# Patient Record
Sex: Female | Born: 1995 | Hispanic: Yes | Marital: Married | Smoking: Never smoker
Health system: Southern US, Community
[De-identification: ages and names within clinical notes are randomized; demographics above are authoritative.]

## PROBLEM LIST (undated history)

## (undated) DIAGNOSIS — N9489 Other specified conditions associated with female genital organs and menstrual cycle: Secondary | ICD-10-CM

## (undated) DIAGNOSIS — F32A Depression, unspecified: Secondary | ICD-10-CM

## (undated) DIAGNOSIS — S069X9A Unspecified intracranial injury with loss of consciousness of unspecified duration, initial encounter: Secondary | ICD-10-CM

## (undated) DIAGNOSIS — F419 Anxiety disorder, unspecified: Secondary | ICD-10-CM

## (undated) HISTORY — DX: Other specified conditions associated with female genital organs and menstrual cycle: N94.89

## (undated) HISTORY — PX: TONSILLECTOMY: SUR1361

---
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
## 2015-06-08 ENCOUNTER — Other Ambulatory Visit (HOSPITAL_COMMUNITY)
Admission: RE | Admit: 2015-06-08 | Discharge: 2015-06-08 | Disposition: A | Discharge status: Home / Self Care | Payer: Managed Care, Other (non HMO) | Source: Ambulatory Visit | Attending: Family Medicine | Admitting: Family Medicine

## 2015-06-08 DIAGNOSIS — Z01419 Encounter for gynecological examination (general) (routine) without abnormal findings: Secondary | ICD-10-CM | POA: Diagnosis present

## 2017-01-05 DIAGNOSIS — N926 Irregular menstruation, unspecified: Secondary | ICD-10-CM | POA: Diagnosis not present

## 2017-06-13 DIAGNOSIS — Z0100 Encounter for examination of eyes and vision without abnormal findings: Secondary | ICD-10-CM | POA: Diagnosis not present

## 2017-06-13 DIAGNOSIS — Z23 Encounter for immunization: Secondary | ICD-10-CM | POA: Diagnosis not present

## 2017-06-13 DIAGNOSIS — Z0000 Encounter for general adult medical examination without abnormal findings: Secondary | ICD-10-CM | POA: Diagnosis not present

## 2017-07-17 HISTORY — PX: SPINAL FUSION: SHX223

## 2017-07-17 HISTORY — PX: TRACHEOSTOMY CLOSURE: SHX458

## 2017-07-17 HISTORY — PX: OTHER SURGICAL HISTORY: SHX169

## 2017-07-17 HISTORY — PX: PEG TUBE REMOVAL: SHX2187

## 2017-08-08 DIAGNOSIS — J029 Acute pharyngitis, unspecified: Secondary | ICD-10-CM | POA: Diagnosis not present

## 2017-12-15 DIAGNOSIS — S0285XA Fracture of orbit, unspecified, initial encounter for closed fracture: Secondary | ICD-10-CM

## 2017-12-15 DIAGNOSIS — S271XXA Traumatic hemothorax, initial encounter: Secondary | ICD-10-CM

## 2017-12-15 DIAGNOSIS — Z8679 Personal history of other diseases of the circulatory system: Secondary | ICD-10-CM

## 2017-12-15 DIAGNOSIS — S0230XA Fracture of orbital floor, unspecified side, initial encounter for closed fracture: Secondary | ICD-10-CM

## 2017-12-15 DIAGNOSIS — S069X9A Unspecified intracranial injury with loss of consciousness of unspecified duration, initial encounter: Secondary | ICD-10-CM

## 2017-12-15 DIAGNOSIS — I609 Nontraumatic subarachnoid hemorrhage, unspecified: Secondary | ICD-10-CM

## 2017-12-15 DIAGNOSIS — S069XAA Unspecified intracranial injury with loss of consciousness status unknown, initial encounter: Secondary | ICD-10-CM

## 2017-12-15 DIAGNOSIS — S42309A Unspecified fracture of shaft of humerus, unspecified arm, initial encounter for closed fracture: Secondary | ICD-10-CM

## 2017-12-15 DIAGNOSIS — S22089A Unspecified fracture of T11-T12 vertebra, initial encounter for closed fracture: Secondary | ICD-10-CM

## 2017-12-15 HISTORY — DX: Fracture of orbit, unspecified, initial encounter for closed fracture: S02.85XA

## 2017-12-15 HISTORY — DX: Unspecified intracranial injury with loss of consciousness status unknown, initial encounter: S06.9XAA

## 2017-12-15 HISTORY — DX: Personal history of other diseases of the circulatory system: Z86.79

## 2017-12-15 HISTORY — DX: Nontraumatic subarachnoid hemorrhage, unspecified: I60.9

## 2017-12-15 HISTORY — DX: Unspecified intracranial injury with loss of consciousness of unspecified duration, initial encounter: S06.9X9A

## 2017-12-15 HISTORY — DX: Unspecified fracture of shaft of humerus, unspecified arm, initial encounter for closed fracture: S42.309A

## 2017-12-15 HISTORY — DX: Fracture of orbital floor, unspecified side, initial encounter for closed fracture: S02.30XA

## 2017-12-15 HISTORY — DX: Unspecified fracture of t11-T12 vertebra, initial encounter for closed fracture: S22.089A

## 2017-12-15 HISTORY — DX: Traumatic hemothorax, initial encounter: S27.1XXA

## 2018-01-12 DIAGNOSIS — Y92810 Car as the place of occurrence of the external cause: Secondary | ICD-10-CM | POA: Diagnosis not present

## 2018-01-12 DIAGNOSIS — T07XXXA Unspecified multiple injuries, initial encounter: Secondary | ICD-10-CM | POA: Diagnosis not present

## 2018-01-12 DIAGNOSIS — Z87820 Personal history of traumatic brain injury: Secondary | ICD-10-CM | POA: Insufficient documentation

## 2018-01-14 HISTORY — PX: PEG TUBE PLACEMENT: SUR1034

## 2018-01-14 HISTORY — PX: TRACHEOSTOMY: SUR1362

## 2018-01-23 DIAGNOSIS — T07XXXA Unspecified multiple injuries, initial encounter: Secondary | ICD-10-CM | POA: Diagnosis not present

## 2018-01-25 DIAGNOSIS — S22089A Unspecified fracture of T11-T12 vertebra, initial encounter for closed fracture: Secondary | ICD-10-CM | POA: Diagnosis not present

## 2018-01-25 DIAGNOSIS — Z430 Encounter for attention to tracheostomy: Secondary | ICD-10-CM | POA: Diagnosis not present

## 2018-01-25 DIAGNOSIS — Z931 Gastrostomy status: Secondary | ICD-10-CM | POA: Diagnosis not present

## 2018-01-25 DIAGNOSIS — L708 Other acne: Secondary | ICD-10-CM | POA: Diagnosis not present

## 2018-01-25 DIAGNOSIS — S42332A Displaced oblique fracture of shaft of humerus, left arm, initial encounter for closed fracture: Secondary | ICD-10-CM | POA: Diagnosis not present

## 2018-01-25 DIAGNOSIS — S22088A Other fracture of T11-T12 vertebra, initial encounter for closed fracture: Secondary | ICD-10-CM | POA: Diagnosis not present

## 2018-01-25 DIAGNOSIS — J151 Pneumonia due to Pseudomonas: Secondary | ICD-10-CM | POA: Diagnosis not present

## 2018-01-25 DIAGNOSIS — S2242XA Multiple fractures of ribs, left side, initial encounter for closed fracture: Secondary | ICD-10-CM | POA: Diagnosis not present

## 2018-01-25 DIAGNOSIS — S42492A Other displaced fracture of lower end of left humerus, initial encounter for closed fracture: Secondary | ICD-10-CM | POA: Diagnosis not present

## 2018-01-25 DIAGNOSIS — R451 Restlessness and agitation: Secondary | ICD-10-CM | POA: Diagnosis not present

## 2018-01-25 DIAGNOSIS — H468 Other optic neuritis: Secondary | ICD-10-CM | POA: Diagnosis not present

## 2018-01-25 DIAGNOSIS — J9601 Acute respiratory failure with hypoxia: Secondary | ICD-10-CM | POA: Diagnosis not present

## 2018-01-25 DIAGNOSIS — Z9911 Dependence on respirator [ventilator] status: Secondary | ICD-10-CM | POA: Diagnosis not present

## 2018-01-25 DIAGNOSIS — I499 Cardiac arrhythmia, unspecified: Secondary | ICD-10-CM | POA: Diagnosis not present

## 2018-01-25 DIAGNOSIS — S0232XA Fracture of orbital floor, left side, initial encounter for closed fracture: Secondary | ICD-10-CM | POA: Diagnosis not present

## 2018-01-25 DIAGNOSIS — S04012A Injury of optic nerve, left eye, initial encounter: Secondary | ICD-10-CM | POA: Diagnosis not present

## 2018-01-25 DIAGNOSIS — R279 Unspecified lack of coordination: Secondary | ICD-10-CM | POA: Diagnosis not present

## 2018-01-25 DIAGNOSIS — S2232XA Fracture of one rib, left side, initial encounter for closed fracture: Secondary | ICD-10-CM | POA: Diagnosis not present

## 2018-01-25 DIAGNOSIS — S22081A Stable burst fracture of T11-T12 vertebra, initial encounter for closed fracture: Secondary | ICD-10-CM | POA: Diagnosis not present

## 2018-01-25 DIAGNOSIS — E878 Other disorders of electrolyte and fluid balance, not elsewhere classified: Secondary | ICD-10-CM | POA: Diagnosis not present

## 2018-01-25 DIAGNOSIS — S062X9A Diffuse traumatic brain injury with loss of consciousness of unspecified duration, initial encounter: Secondary | ICD-10-CM | POA: Diagnosis not present

## 2018-01-25 DIAGNOSIS — D62 Acute posthemorrhagic anemia: Secondary | ICD-10-CM | POA: Diagnosis not present

## 2018-01-25 DIAGNOSIS — Z743 Need for continuous supervision: Secondary | ICD-10-CM | POA: Diagnosis not present

## 2018-01-25 DIAGNOSIS — S42302A Unspecified fracture of shaft of humerus, left arm, initial encounter for closed fracture: Secondary | ICD-10-CM | POA: Diagnosis not present

## 2018-01-25 DIAGNOSIS — M7989 Other specified soft tissue disorders: Secondary | ICD-10-CM | POA: Diagnosis not present

## 2018-01-25 DIAGNOSIS — H0220C Unspecified lagophthalmos, bilateral, upper and lower eyelids: Secondary | ICD-10-CM | POA: Diagnosis not present

## 2018-01-25 DIAGNOSIS — R918 Other nonspecific abnormal finding of lung field: Secondary | ICD-10-CM | POA: Diagnosis not present

## 2018-01-25 DIAGNOSIS — Z041 Encounter for examination and observation following transport accident: Secondary | ICD-10-CM | POA: Diagnosis not present

## 2018-01-25 DIAGNOSIS — S2243XA Multiple fractures of ribs, bilateral, initial encounter for closed fracture: Secondary | ICD-10-CM | POA: Diagnosis not present

## 2018-01-25 DIAGNOSIS — I7771 Dissection of carotid artery: Secondary | ICD-10-CM | POA: Diagnosis not present

## 2018-01-25 DIAGNOSIS — R509 Fever, unspecified: Secondary | ICD-10-CM | POA: Diagnosis not present

## 2018-01-25 DIAGNOSIS — S0282XA Fracture of other specified skull and facial bones, left side, initial encounter for closed fracture: Secondary | ICD-10-CM | POA: Diagnosis not present

## 2018-01-25 DIAGNOSIS — S06369A Traumatic hemorrhage of cerebrum, unspecified, with loss of consciousness of unspecified duration, initial encounter: Secondary | ICD-10-CM | POA: Diagnosis not present

## 2018-01-25 DIAGNOSIS — S42342D Displaced spiral fracture of shaft of humerus, left arm, subsequent encounter for fracture with routine healing: Secondary | ICD-10-CM | POA: Diagnosis not present

## 2018-01-25 DIAGNOSIS — S15091A Other specified injury of right carotid artery, initial encounter: Secondary | ICD-10-CM | POA: Diagnosis not present

## 2018-01-25 DIAGNOSIS — S06819D Injury of right internal carotid artery, intracranial portion, not elsewhere classified with loss of consciousness of unspecified duration, subsequent encounter: Secondary | ICD-10-CM | POA: Diagnosis not present

## 2018-01-25 DIAGNOSIS — T1490XA Injury, unspecified, initial encounter: Secondary | ICD-10-CM | POA: Diagnosis not present

## 2018-01-25 DIAGNOSIS — Z930 Tracheostomy status: Secondary | ICD-10-CM | POA: Diagnosis not present

## 2018-01-25 DIAGNOSIS — G960 Cerebrospinal fluid leak: Secondary | ICD-10-CM | POA: Diagnosis not present

## 2018-01-25 DIAGNOSIS — R1310 Dysphagia, unspecified: Secondary | ICD-10-CM | POA: Diagnosis not present

## 2018-01-25 DIAGNOSIS — S0292XA Unspecified fracture of facial bones, initial encounter for closed fracture: Secondary | ICD-10-CM | POA: Diagnosis not present

## 2018-01-25 DIAGNOSIS — J9690 Respiratory failure, unspecified, unspecified whether with hypoxia or hypercapnia: Secondary | ICD-10-CM | POA: Diagnosis not present

## 2018-01-25 DIAGNOSIS — S069X9A Unspecified intracranial injury with loss of consciousness of unspecified duration, initial encounter: Secondary | ICD-10-CM | POA: Diagnosis not present

## 2018-01-25 DIAGNOSIS — Z9889 Other specified postprocedural states: Secondary | ICD-10-CM | POA: Diagnosis not present

## 2018-01-25 DIAGNOSIS — S0282XD Fracture of other specified skull and facial bones, left side, subsequent encounter for fracture with routine healing: Secondary | ICD-10-CM | POA: Diagnosis not present

## 2018-01-25 DIAGNOSIS — R4182 Altered mental status, unspecified: Secondary | ICD-10-CM | POA: Diagnosis not present

## 2018-01-25 DIAGNOSIS — R402434 Glasgow coma scale score 3-8, 24 hours or more after hospital admission: Secondary | ICD-10-CM | POA: Diagnosis not present

## 2018-01-25 DIAGNOSIS — S270XXA Traumatic pneumothorax, initial encounter: Secondary | ICD-10-CM | POA: Diagnosis not present

## 2018-01-26 DIAGNOSIS — S04012A Injury of optic nerve, left eye, initial encounter: Secondary | ICD-10-CM | POA: Diagnosis not present

## 2018-01-26 DIAGNOSIS — S0232XA Fracture of orbital floor, left side, initial encounter for closed fracture: Secondary | ICD-10-CM | POA: Diagnosis not present

## 2018-01-26 DIAGNOSIS — H0220C Unspecified lagophthalmos, bilateral, upper and lower eyelids: Secondary | ICD-10-CM | POA: Diagnosis not present

## 2018-01-28 DIAGNOSIS — S062X9A Diffuse traumatic brain injury with loss of consciousness of unspecified duration, initial encounter: Secondary | ICD-10-CM | POA: Diagnosis not present

## 2018-01-28 DIAGNOSIS — S22089A Unspecified fracture of T11-T12 vertebra, initial encounter for closed fracture: Secondary | ICD-10-CM | POA: Diagnosis not present

## 2018-01-28 DIAGNOSIS — S0232XA Fracture of orbital floor, left side, initial encounter for closed fracture: Secondary | ICD-10-CM | POA: Diagnosis not present

## 2018-01-28 DIAGNOSIS — R402434 Glasgow coma scale score 3-8, 24 hours or more after hospital admission: Secondary | ICD-10-CM | POA: Diagnosis not present

## 2018-01-29 DIAGNOSIS — S22089A Unspecified fracture of T11-T12 vertebra, initial encounter for closed fracture: Secondary | ICD-10-CM | POA: Diagnosis not present

## 2018-01-29 DIAGNOSIS — R509 Fever, unspecified: Secondary | ICD-10-CM | POA: Diagnosis not present

## 2018-01-29 DIAGNOSIS — I499 Cardiac arrhythmia, unspecified: Secondary | ICD-10-CM | POA: Diagnosis not present

## 2018-01-29 DIAGNOSIS — S0232XA Fracture of orbital floor, left side, initial encounter for closed fracture: Secondary | ICD-10-CM | POA: Diagnosis not present

## 2018-01-30 DIAGNOSIS — S069X9A Unspecified intracranial injury with loss of consciousness of unspecified duration, initial encounter: Secondary | ICD-10-CM | POA: Diagnosis not present

## 2018-01-30 DIAGNOSIS — S0232XA Fracture of orbital floor, left side, initial encounter for closed fracture: Secondary | ICD-10-CM | POA: Diagnosis not present

## 2018-01-30 DIAGNOSIS — E878 Other disorders of electrolyte and fluid balance, not elsewhere classified: Secondary | ICD-10-CM | POA: Diagnosis not present

## 2018-01-30 DIAGNOSIS — R451 Restlessness and agitation: Secondary | ICD-10-CM | POA: Diagnosis not present

## 2018-01-31 DIAGNOSIS — J9601 Acute respiratory failure with hypoxia: Secondary | ICD-10-CM | POA: Diagnosis not present

## 2018-01-31 DIAGNOSIS — S069X9A Unspecified intracranial injury with loss of consciousness of unspecified duration, initial encounter: Secondary | ICD-10-CM | POA: Diagnosis not present

## 2018-01-31 DIAGNOSIS — L708 Other acne: Secondary | ICD-10-CM | POA: Diagnosis not present

## 2018-01-31 DIAGNOSIS — J151 Pneumonia due to Pseudomonas: Secondary | ICD-10-CM | POA: Diagnosis not present

## 2018-01-31 DIAGNOSIS — S0232XA Fracture of orbital floor, left side, initial encounter for closed fracture: Secondary | ICD-10-CM | POA: Diagnosis not present

## 2018-02-01 DIAGNOSIS — S0232XA Fracture of orbital floor, left side, initial encounter for closed fracture: Secondary | ICD-10-CM | POA: Diagnosis not present

## 2018-02-01 DIAGNOSIS — S069X9A Unspecified intracranial injury with loss of consciousness of unspecified duration, initial encounter: Secondary | ICD-10-CM | POA: Diagnosis not present

## 2018-02-01 DIAGNOSIS — J9601 Acute respiratory failure with hypoxia: Secondary | ICD-10-CM | POA: Diagnosis not present

## 2018-02-01 DIAGNOSIS — L708 Other acne: Secondary | ICD-10-CM | POA: Diagnosis not present

## 2018-02-01 DIAGNOSIS — J151 Pneumonia due to Pseudomonas: Secondary | ICD-10-CM | POA: Diagnosis not present

## 2018-02-06 DIAGNOSIS — S066X6A Traumatic subarachnoid hemorrhage with loss of consciousness greater than 24 hours without return to pre-existing conscious level with patient surviving, initial encounter: Secondary | ICD-10-CM | POA: Diagnosis not present

## 2018-02-06 DIAGNOSIS — S066X9A Traumatic subarachnoid hemorrhage with loss of consciousness of unspecified duration, initial encounter: Secondary | ICD-10-CM | POA: Diagnosis not present

## 2018-02-06 DIAGNOSIS — I10 Essential (primary) hypertension: Secondary | ICD-10-CM | POA: Diagnosis not present

## 2018-02-06 DIAGNOSIS — Z931 Gastrostomy status: Secondary | ICD-10-CM | POA: Diagnosis not present

## 2018-02-06 DIAGNOSIS — X58XXXA Exposure to other specified factors, initial encounter: Secondary | ICD-10-CM | POA: Diagnosis not present

## 2018-02-06 DIAGNOSIS — S42492A Other displaced fracture of lower end of left humerus, initial encounter for closed fracture: Secondary | ICD-10-CM | POA: Diagnosis not present

## 2018-02-06 DIAGNOSIS — T07XXXA Unspecified multiple injuries, initial encounter: Secondary | ICD-10-CM | POA: Diagnosis not present

## 2018-02-06 DIAGNOSIS — Z743 Need for continuous supervision: Secondary | ICD-10-CM | POA: Diagnosis not present

## 2018-02-06 DIAGNOSIS — S42309A Unspecified fracture of shaft of humerus, unspecified arm, initial encounter for closed fracture: Secondary | ICD-10-CM | POA: Insufficient documentation

## 2018-02-06 DIAGNOSIS — S42492D Other displaced fracture of lower end of left humerus, subsequent encounter for fracture with routine healing: Secondary | ICD-10-CM | POA: Diagnosis not present

## 2018-02-06 DIAGNOSIS — Z8781 Personal history of (healed) traumatic fracture: Secondary | ICD-10-CM | POA: Insufficient documentation

## 2018-02-06 DIAGNOSIS — R7881 Bacteremia: Secondary | ICD-10-CM | POA: Diagnosis not present

## 2018-02-06 DIAGNOSIS — S22089A Unspecified fracture of T11-T12 vertebra, initial encounter for closed fracture: Secondary | ICD-10-CM | POA: Diagnosis not present

## 2018-02-06 DIAGNOSIS — Z0389 Encounter for observation for other suspected diseases and conditions ruled out: Secondary | ICD-10-CM | POA: Diagnosis not present

## 2018-02-06 DIAGNOSIS — R279 Unspecified lack of coordination: Secondary | ICD-10-CM | POA: Diagnosis not present

## 2018-02-06 DIAGNOSIS — S42402D Unspecified fracture of lower end of left humerus, subsequent encounter for fracture with routine healing: Secondary | ICD-10-CM | POA: Diagnosis not present

## 2018-02-06 DIAGNOSIS — G908 Other disorders of autonomic nervous system: Secondary | ICD-10-CM | POA: Diagnosis not present

## 2018-02-06 DIAGNOSIS — J9690 Respiratory failure, unspecified, unspecified whether with hypoxia or hypercapnia: Secondary | ICD-10-CM | POA: Diagnosis not present

## 2018-02-06 DIAGNOSIS — I499 Cardiac arrhythmia, unspecified: Secondary | ICD-10-CM | POA: Diagnosis not present

## 2018-02-06 DIAGNOSIS — X58XXXD Exposure to other specified factors, subsequent encounter: Secondary | ICD-10-CM | POA: Diagnosis not present

## 2018-02-06 DIAGNOSIS — M25551 Pain in right hip: Secondary | ICD-10-CM | POA: Diagnosis not present

## 2018-02-06 DIAGNOSIS — Z930 Tracheostomy status: Secondary | ICD-10-CM | POA: Diagnosis not present

## 2018-02-06 DIAGNOSIS — J9610 Chronic respiratory failure, unspecified whether with hypoxia or hypercapnia: Secondary | ICD-10-CM | POA: Diagnosis not present

## 2018-02-06 DIAGNOSIS — S066X6D Traumatic subarachnoid hemorrhage with loss of consciousness greater than 24 hours without return to pre-existing conscious level with patient surviving, subsequent encounter: Secondary | ICD-10-CM | POA: Diagnosis not present

## 2018-02-06 DIAGNOSIS — I609 Nontraumatic subarachnoid hemorrhage, unspecified: Secondary | ICD-10-CM | POA: Diagnosis not present

## 2018-02-06 DIAGNOSIS — R443 Hallucinations, unspecified: Secondary | ICD-10-CM | POA: Diagnosis not present

## 2018-02-06 DIAGNOSIS — I7771 Dissection of carotid artery: Secondary | ICD-10-CM | POA: Diagnosis not present

## 2018-02-06 DIAGNOSIS — E870 Hyperosmolality and hypernatremia: Secondary | ICD-10-CM | POA: Diagnosis not present

## 2018-02-06 DIAGNOSIS — K5900 Constipation, unspecified: Secondary | ICD-10-CM | POA: Diagnosis not present

## 2018-02-06 DIAGNOSIS — S0990XD Unspecified injury of head, subsequent encounter: Secondary | ICD-10-CM | POA: Diagnosis not present

## 2018-02-06 DIAGNOSIS — S04012A Injury of optic nerve, left eye, initial encounter: Secondary | ICD-10-CM | POA: Diagnosis not present

## 2018-02-06 DIAGNOSIS — Z781 Physical restraint status: Secondary | ICD-10-CM | POA: Diagnosis not present

## 2018-02-06 DIAGNOSIS — S062X9A Diffuse traumatic brain injury with loss of consciousness of unspecified duration, initial encounter: Secondary | ICD-10-CM | POA: Diagnosis not present

## 2018-02-06 DIAGNOSIS — N319 Neuromuscular dysfunction of bladder, unspecified: Secondary | ICD-10-CM | POA: Diagnosis not present

## 2018-02-06 DIAGNOSIS — Z981 Arthrodesis status: Secondary | ICD-10-CM | POA: Diagnosis not present

## 2018-02-06 DIAGNOSIS — S062X9D Diffuse traumatic brain injury with loss of consciousness of unspecified duration, subsequent encounter: Secondary | ICD-10-CM | POA: Diagnosis not present

## 2018-02-06 DIAGNOSIS — M25552 Pain in left hip: Secondary | ICD-10-CM | POA: Diagnosis not present

## 2018-02-06 DIAGNOSIS — R1310 Dysphagia, unspecified: Secondary | ICD-10-CM | POA: Diagnosis not present

## 2018-02-11 DIAGNOSIS — S066X6D Traumatic subarachnoid hemorrhage with loss of consciousness greater than 24 hours without return to pre-existing conscious level with patient surviving, subsequent encounter: Secondary | ICD-10-CM | POA: Diagnosis not present

## 2018-02-11 DIAGNOSIS — T07XXXA Unspecified multiple injuries, initial encounter: Secondary | ICD-10-CM | POA: Diagnosis not present

## 2018-02-11 DIAGNOSIS — S42492A Other displaced fracture of lower end of left humerus, initial encounter for closed fracture: Secondary | ICD-10-CM | POA: Diagnosis not present

## 2018-02-12 DIAGNOSIS — S066X6D Traumatic subarachnoid hemorrhage with loss of consciousness greater than 24 hours without return to pre-existing conscious level with patient surviving, subsequent encounter: Secondary | ICD-10-CM | POA: Diagnosis not present

## 2018-02-13 DIAGNOSIS — S066X6D Traumatic subarachnoid hemorrhage with loss of consciousness greater than 24 hours without return to pre-existing conscious level with patient surviving, subsequent encounter: Secondary | ICD-10-CM | POA: Diagnosis not present

## 2018-02-14 DIAGNOSIS — S066X6D Traumatic subarachnoid hemorrhage with loss of consciousness greater than 24 hours without return to pre-existing conscious level with patient surviving, subsequent encounter: Secondary | ICD-10-CM | POA: Diagnosis not present

## 2018-02-15 DIAGNOSIS — S066X6D Traumatic subarachnoid hemorrhage with loss of consciousness greater than 24 hours without return to pre-existing conscious level with patient surviving, subsequent encounter: Secondary | ICD-10-CM | POA: Diagnosis not present

## 2018-02-16 DIAGNOSIS — S066X6D Traumatic subarachnoid hemorrhage with loss of consciousness greater than 24 hours without return to pre-existing conscious level with patient surviving, subsequent encounter: Secondary | ICD-10-CM | POA: Diagnosis not present

## 2018-02-17 DIAGNOSIS — S066X6D Traumatic subarachnoid hemorrhage with loss of consciousness greater than 24 hours without return to pre-existing conscious level with patient surviving, subsequent encounter: Secondary | ICD-10-CM | POA: Diagnosis not present

## 2018-02-18 DIAGNOSIS — Z981 Arthrodesis status: Secondary | ICD-10-CM | POA: Diagnosis not present

## 2018-02-18 DIAGNOSIS — X58XXXA Exposure to other specified factors, initial encounter: Secondary | ICD-10-CM | POA: Diagnosis not present

## 2018-02-18 DIAGNOSIS — S22089A Unspecified fracture of T11-T12 vertebra, initial encounter for closed fracture: Secondary | ICD-10-CM | POA: Diagnosis not present

## 2018-02-19 DIAGNOSIS — S066X6D Traumatic subarachnoid hemorrhage with loss of consciousness greater than 24 hours without return to pre-existing conscious level with patient surviving, subsequent encounter: Secondary | ICD-10-CM | POA: Diagnosis not present

## 2018-02-20 DIAGNOSIS — S066X6D Traumatic subarachnoid hemorrhage with loss of consciousness greater than 24 hours without return to pre-existing conscious level with patient surviving, subsequent encounter: Secondary | ICD-10-CM | POA: Diagnosis not present

## 2018-03-03 DIAGNOSIS — K5900 Constipation, unspecified: Secondary | ICD-10-CM | POA: Diagnosis not present

## 2018-03-06 DIAGNOSIS — J189 Pneumonia, unspecified organism: Secondary | ICD-10-CM | POA: Diagnosis not present

## 2018-03-06 DIAGNOSIS — N319 Neuromuscular dysfunction of bladder, unspecified: Secondary | ICD-10-CM | POA: Diagnosis not present

## 2018-03-06 DIAGNOSIS — H0220C Unspecified lagophthalmos, bilateral, upper and lower eyelids: Secondary | ICD-10-CM | POA: Diagnosis not present

## 2018-03-06 DIAGNOSIS — S42402D Unspecified fracture of lower end of left humerus, subsequent encounter for fracture with routine healing: Secondary | ICD-10-CM | POA: Diagnosis not present

## 2018-03-06 DIAGNOSIS — S06309D Unspecified focal traumatic brain injury with loss of consciousness of unspecified duration, subsequent encounter: Secondary | ICD-10-CM | POA: Diagnosis not present

## 2018-03-06 DIAGNOSIS — I609 Nontraumatic subarachnoid hemorrhage, unspecified: Secondary | ICD-10-CM | POA: Diagnosis not present

## 2018-03-06 DIAGNOSIS — S42302A Unspecified fracture of shaft of humerus, left arm, initial encounter for closed fracture: Secondary | ICD-10-CM | POA: Diagnosis not present

## 2018-03-06 DIAGNOSIS — X58XXXD Exposure to other specified factors, subsequent encounter: Secondary | ICD-10-CM | POA: Diagnosis not present

## 2018-03-06 DIAGNOSIS — S22089D Unspecified fracture of T11-T12 vertebra, subsequent encounter for fracture with routine healing: Secondary | ICD-10-CM | POA: Diagnosis not present

## 2018-03-06 DIAGNOSIS — I7771 Dissection of carotid artery: Secondary | ICD-10-CM | POA: Diagnosis not present

## 2018-03-06 DIAGNOSIS — R252 Cramp and spasm: Secondary | ICD-10-CM | POA: Diagnosis not present

## 2018-03-06 DIAGNOSIS — S15091D Other specified injury of right carotid artery, subsequent encounter: Secondary | ICD-10-CM | POA: Diagnosis not present

## 2018-03-06 DIAGNOSIS — S0282XD Fracture of other specified skull and facial bones, left side, subsequent encounter for fracture with routine healing: Secondary | ICD-10-CM | POA: Diagnosis not present

## 2018-03-06 DIAGNOSIS — R21 Rash and other nonspecific skin eruption: Secondary | ICD-10-CM | POA: Diagnosis not present

## 2018-03-06 DIAGNOSIS — R262 Difficulty in walking, not elsewhere classified: Secondary | ICD-10-CM | POA: Diagnosis not present

## 2018-03-06 DIAGNOSIS — S42351D Displaced comminuted fracture of shaft of humerus, right arm, subsequent encounter for fracture with routine healing: Secondary | ICD-10-CM | POA: Diagnosis not present

## 2018-03-06 DIAGNOSIS — S271XXD Traumatic hemothorax, subsequent encounter: Secondary | ICD-10-CM | POA: Diagnosis not present

## 2018-03-06 DIAGNOSIS — S42302D Unspecified fracture of shaft of humerus, left arm, subsequent encounter for fracture with routine healing: Secondary | ICD-10-CM | POA: Diagnosis not present

## 2018-03-06 DIAGNOSIS — H468 Other optic neuritis: Secondary | ICD-10-CM | POA: Diagnosis not present

## 2018-03-06 DIAGNOSIS — S066X6D Traumatic subarachnoid hemorrhage with loss of consciousness greater than 24 hours without return to pre-existing conscious level with patient surviving, subsequent encounter: Secondary | ICD-10-CM | POA: Diagnosis not present

## 2018-03-06 DIAGNOSIS — Z431 Encounter for attention to gastrostomy: Secondary | ICD-10-CM | POA: Diagnosis not present

## 2018-03-06 DIAGNOSIS — N912 Amenorrhea, unspecified: Secondary | ICD-10-CM | POA: Diagnosis not present

## 2018-03-06 DIAGNOSIS — G4700 Insomnia, unspecified: Secondary | ICD-10-CM | POA: Diagnosis not present

## 2018-03-06 DIAGNOSIS — S42492D Other displaced fracture of lower end of left humerus, subsequent encounter for fracture with routine healing: Secondary | ICD-10-CM | POA: Diagnosis not present

## 2018-03-06 DIAGNOSIS — R1310 Dysphagia, unspecified: Secondary | ICD-10-CM | POA: Diagnosis not present

## 2018-03-09 DIAGNOSIS — S066X6D Traumatic subarachnoid hemorrhage with loss of consciousness greater than 24 hours without return to pre-existing conscious level with patient surviving, subsequent encounter: Secondary | ICD-10-CM | POA: Diagnosis not present

## 2018-03-19 DIAGNOSIS — S066X6D Traumatic subarachnoid hemorrhage with loss of consciousness greater than 24 hours without return to pre-existing conscious level with patient surviving, subsequent encounter: Secondary | ICD-10-CM | POA: Diagnosis not present

## 2018-03-20 DIAGNOSIS — S066X6D Traumatic subarachnoid hemorrhage with loss of consciousness greater than 24 hours without return to pre-existing conscious level with patient surviving, subsequent encounter: Secondary | ICD-10-CM | POA: Diagnosis not present

## 2018-03-21 DIAGNOSIS — S066X6D Traumatic subarachnoid hemorrhage with loss of consciousness greater than 24 hours without return to pre-existing conscious level with patient surviving, subsequent encounter: Secondary | ICD-10-CM | POA: Diagnosis not present

## 2018-03-22 DIAGNOSIS — S066X6D Traumatic subarachnoid hemorrhage with loss of consciousness greater than 24 hours without return to pre-existing conscious level with patient surviving, subsequent encounter: Secondary | ICD-10-CM | POA: Diagnosis not present

## 2018-04-02 DIAGNOSIS — S066X6D Traumatic subarachnoid hemorrhage with loss of consciousness greater than 24 hours without return to pre-existing conscious level with patient surviving, subsequent encounter: Secondary | ICD-10-CM | POA: Diagnosis not present

## 2018-04-03 DIAGNOSIS — S066X6D Traumatic subarachnoid hemorrhage with loss of consciousness greater than 24 hours without return to pre-existing conscious level with patient surviving, subsequent encounter: Secondary | ICD-10-CM | POA: Diagnosis not present

## 2018-04-04 DIAGNOSIS — S066X6D Traumatic subarachnoid hemorrhage with loss of consciousness greater than 24 hours without return to pre-existing conscious level with patient surviving, subsequent encounter: Secondary | ICD-10-CM | POA: Diagnosis not present

## 2018-04-04 MED ORDER — MECLIZINE HCL 25 MG PO TABS
12.50 | ORAL_TABLET | ORAL | Status: DC
Start: ? — End: 2018-04-04

## 2018-04-04 MED ORDER — GENERIC EXTERNAL MEDICATION
10.00 | Status: DC
Start: ? — End: 2018-04-04

## 2018-04-04 MED ORDER — FLUOXETINE HCL 20 MG PO CAPS
20.00 | ORAL_CAPSULE | ORAL | Status: DC
Start: 2018-04-05 — End: 2018-04-04

## 2018-04-04 MED ORDER — GENERIC EXTERNAL MEDICATION
4.00 | Status: DC
Start: ? — End: 2018-04-04

## 2018-04-04 MED ORDER — CALCIUM CITRATE-VITAMIN D 315-250 MG-UNIT PO TABS
2.00 | ORAL_TABLET | ORAL | Status: DC
Start: 2018-04-04 — End: 2018-04-04

## 2018-04-04 MED ORDER — OXYCODONE HCL 5 MG PO TABS
2.50 | ORAL_TABLET | ORAL | Status: DC
Start: ? — End: 2018-04-04

## 2018-04-04 MED ORDER — GENERIC EXTERNAL MEDICATION
25.00 | Status: DC
Start: ? — End: 2018-04-04

## 2018-04-04 MED ORDER — MELATONIN 3 MG PO TABS
6.00 | ORAL_TABLET | ORAL | Status: DC
Start: 2018-04-04 — End: 2018-04-04

## 2018-04-04 MED ORDER — GENERIC EXTERNAL MEDICATION
650.00 | Status: DC
Start: ? — End: 2018-04-04

## 2018-04-04 MED ORDER — LACTULOSE 10 GM/15ML PO SOLN
20.00 | ORAL | Status: DC
Start: ? — End: 2018-04-04

## 2018-04-05 DIAGNOSIS — S42309A Unspecified fracture of shaft of humerus, unspecified arm, initial encounter for closed fracture: Secondary | ICD-10-CM | POA: Diagnosis not present

## 2018-04-05 DIAGNOSIS — S0291XA Unspecified fracture of skull, initial encounter for closed fracture: Secondary | ICD-10-CM | POA: Diagnosis not present

## 2018-04-05 DIAGNOSIS — S22089A Unspecified fracture of T11-T12 vertebra, initial encounter for closed fracture: Secondary | ICD-10-CM | POA: Diagnosis not present

## 2018-04-08 ENCOUNTER — Ambulatory Visit: Payer: BLUE CROSS/BLUE SHIELD | Attending: Physical Medicine and Rehabilitation | Admitting: Speech Pathology

## 2018-04-08 ENCOUNTER — Encounter: Payer: Self-pay | Admitting: Occupational Therapy

## 2018-04-08 ENCOUNTER — Other Ambulatory Visit: Payer: Self-pay

## 2018-04-08 ENCOUNTER — Ambulatory Visit: Payer: BLUE CROSS/BLUE SHIELD | Admitting: Occupational Therapy

## 2018-04-08 ENCOUNTER — Ambulatory Visit: Payer: BLUE CROSS/BLUE SHIELD

## 2018-04-08 DIAGNOSIS — M6281 Muscle weakness (generalized): Secondary | ICD-10-CM

## 2018-04-08 DIAGNOSIS — R2689 Other abnormalities of gait and mobility: Secondary | ICD-10-CM

## 2018-04-08 DIAGNOSIS — R278 Other lack of coordination: Secondary | ICD-10-CM | POA: Diagnosis not present

## 2018-04-08 DIAGNOSIS — R41841 Cognitive communication deficit: Secondary | ICD-10-CM | POA: Insufficient documentation

## 2018-04-08 DIAGNOSIS — R490 Dysphonia: Secondary | ICD-10-CM

## 2018-04-08 DIAGNOSIS — R2681 Unsteadiness on feet: Secondary | ICD-10-CM | POA: Insufficient documentation
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
## 2018-04-08 NOTE — Patient Instructions (Signed)
Access Code: [email or web address]  URL: [email or web address]  Date: 04/08/2018  Prepared by: Roxana Hires   Exercises  Backward Walking with Counter Support - 10 reps - 2 sets - 2x daily - 7x weekly  Tandem Stance with Head Rotation - 3 reps - 30 seconds hold - 2x daily - 7x weekly

## 2018-04-09 ENCOUNTER — Encounter: Payer: Self-pay | Admitting: Speech Pathology
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
## 2018-04-10 DIAGNOSIS — F411 Generalized anxiety disorder: Secondary | ICD-10-CM | POA: Diagnosis not present

## 2018-04-10 DIAGNOSIS — R490 Dysphonia: Secondary | ICD-10-CM | POA: Diagnosis not present

## 2018-04-10 DIAGNOSIS — N949 Unspecified condition associated with female genital organs and menstrual cycle: Secondary | ICD-10-CM | POA: Diagnosis not present

## 2018-04-10 DIAGNOSIS — M79603 Pain in arm, unspecified: Secondary | ICD-10-CM | POA: Diagnosis not present

## 2018-04-11 ENCOUNTER — Encounter: Payer: Self-pay | Admitting: Speech Pathology

## 2018-04-11 ENCOUNTER — Encounter: Payer: Self-pay | Admitting: Physical Therapy

## 2018-04-11 ENCOUNTER — Ambulatory Visit: Payer: BLUE CROSS/BLUE SHIELD | Admitting: Speech Pathology

## 2018-04-11 ENCOUNTER — Ambulatory Visit: Payer: BLUE CROSS/BLUE SHIELD | Admitting: Physical Therapy

## 2018-04-11 VITALS — BP 127/67 | HR 76

## 2018-04-11 DIAGNOSIS — R490 Dysphonia: Secondary | ICD-10-CM | POA: Diagnosis not present

## 2018-04-11 DIAGNOSIS — R41841 Cognitive communication deficit: Secondary | ICD-10-CM

## 2018-04-11 DIAGNOSIS — R2689 Other abnormalities of gait and mobility: Secondary | ICD-10-CM

## 2018-04-11 DIAGNOSIS — R2681 Unsteadiness on feet: Secondary | ICD-10-CM

## 2018-04-11 DIAGNOSIS — R278 Other lack of coordination: Secondary | ICD-10-CM | POA: Diagnosis not present

## 2018-04-11 DIAGNOSIS — M6281 Muscle weakness (generalized): Secondary | ICD-10-CM | POA: Diagnosis not present

## 2018-04-11 NOTE — Therapy (Signed)
[place] MAIN [facility] SERVICES [address] [place], [place], [address] Phone: [phone]   Fax:  [phone]  Speech Language Pathology Treatment  Patient Details  Name: Monica Forbes MRN: [id number] Date of Birth: [birth year] Referring Provider (SLP): Doran Clay    Encounter Date: 04/11/2018  End of Session - 04/11/18 1403    Visit Number  2    Number of Visits  9    Date for SLP Re-Evaluation  05/08/18    SLP Start Time  1324    SLP Stop Time   4010    SLP Time Calculation (min)  50 min    Activity Tolerance  Patient tolerated treatment well       History reviewed. No pertinent past medical history.  History reviewed. No pertinent surgical history.  There were no vitals filed for this visit.  Subjective Assessment - 04/11/18 1358    Subjective  Pt seen with fiance in attendance    Patient is accompained by:  Family member   fiance   Currently in Pain?  No/denies            ADULT SLP TREATMENT - 04/11/18 0001      General Information   Behavior/Cognition  Alert;Cooperative;Pleasant mood      Treatment Provided   Treatment provided  Cognitive-Linquistic      Pain Assessment   Pain Assessment  No/denies pain      Cognitive-Linquistic Treatment   Treatment focused on  Cognition;Patient/family/caregiver education    Skilled Treatment  SLP reviewed levels of attention with pt/fiance, and discussed goals established at evaluation. Pt recalled 10 words with 40% accuracy, increasing to 100% with repetition of list or cues. She recalled 90% after 10 minute delay. Visual scanning task completed accurately with pt using finger as guide. 1 error when using visual scanning only. Pt completed deductive reasoning puzzle #1 in 6 minutes with 100% accuracy, no assistance given. She completed puzzle #2 with music playing as a distraction, in 5 minutes, 100% accuracy.       Assessment / Recommendations / Plan   Plan  Continue with  current plan of care      Progression Toward Goals   Progression toward goals  Progressing toward goals       SLP Education - 04/11/18 1403    Education Details  goals, levels of attention, encouraged pt to complete deductive reasoning puzzles with increased attention/distractions at home.    Person(s) Educated  Patient   fiance   Methods  Explanation;Demonstration;Handout    Comprehension  Verbalized understanding         SLP Long Term Goals - 04/11/18 1406      SLP LONG TERM GOAL #1   Title  Patient will demonstrate functional cognitive-communication skills for successful return to work.    Time  4    Period  Weeks    Status  On-going    Target Date  05/08/18      SLP LONG TERM GOAL #2   Title  Patient will complete complex attention/vigilance/memory tasks with 80% accuracy.    Time  4    Period  Weeks    Status  On-going    Target Date  05/08/18      SLP LONG TERM GOAL #3   Title  Patient will complete complex executive function activities with 80% accuracy.    Time  4    Period  Weeks    Status  On-going  Target Date  05/08/18       Plan - 04/11/18 1404    Clinical Impression Statement  Pt reports being organized prior to injury, and verbalizes the need to develop systems to stay on track, as she is more distractible now. Pt is motivated to improve, and reports "it will be fine as long as I check my work". Continued skilled ST intervention is recommended to maximize executive functions to allow pt to return to work and full independence.    Speech Therapy Frequency  1x /week    Duration  4 weeks    Treatment/Interventions  Cognitive reorganization;Patient/family education;SLP instruction and feedback    Potential to Achieve Goals  Good    Potential Considerations  Ability to learn/carryover information;Pain level;Co-morbidities;Previous level of function;Cooperation/participation level;Severity of impairments;Medical prognosis    SLP Home Exercise Plan  TBD-  has respiratory muscle training equipment, deductive reasoning puzzles with distractions, alternating/divided attention challenges    Consulted and Agree with Plan of Care  Patient;Other (Comment)   fiance      Patient will benefit from skilled therapeutic intervention in order to improve the following deficits and impairments:   Cognitive communication deficit    Problem List There are no active problems to display for this patient.  Cristoval Teall B. Quentin Ore, [facility], CCC-SLP Speech Language Pathologist  Shonna Chock 04/11/2018, 2:08 PM  [place] MAIN [facility] SERVICES [address] [place], [place], [address] Phone: [phone]   Fax:  [phone]   Name: NNEOMA HARRAL MRN: [id number] Date of Birth: 18-Sep-1995
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
## 2018-04-13 NOTE — Therapy (Signed)
[place] MAIN [facility] SERVICES [address] [place], [place], [address] Phone: [phone]   Fax:  [phone]  Occupational Therapy Evaluation  Patient Details  Name: Monica Forbes MRN: [id number] Date of Birth: 1996/01/17 No data recorded  Encounter Date: 04/08/2018    History reviewed. No pertinent past medical history.  History reviewed. No pertinent surgical history.  There were no vitals filed for this visit.                                    Patient will benefit from skilled therapeutic intervention in order to improve the following deficits and impairments:     Visit Diagnosis: Muscle weakness (generalized)  Other lack of coordination  Cognitive communication deficit    Problem List There are no active problems to display for this patient.  Achilles Dunk, OTR/L, CLT  Ygnacio Fecteau 04/13/2018, 7:19 PM  [place] MAIN [facility] SERVICES [address] [place], [place], [address] Phone: [phone]   Fax:  [phone]  Name: Monica Forbes MRN: [id number] Date of Birth: Apr 11, 1996

## 2018-04-15 ENCOUNTER — Encounter: Payer: Managed Care, Other (non HMO) | Admitting: Occupational Therapy

## 2018-04-15 ENCOUNTER — Encounter: Payer: Self-pay | Admitting: Physical Therapy

## 2018-04-15 ENCOUNTER — Ambulatory Visit: Payer: BLUE CROSS/BLUE SHIELD | Admitting: Physical Therapy

## 2018-04-15 DIAGNOSIS — R2689 Other abnormalities of gait and mobility: Secondary | ICD-10-CM | POA: Diagnosis not present

## 2018-04-15 DIAGNOSIS — R2681 Unsteadiness on feet: Secondary | ICD-10-CM

## 2018-04-15 DIAGNOSIS — R490 Dysphonia: Secondary | ICD-10-CM | POA: Diagnosis not present

## 2018-04-15 DIAGNOSIS — M6281 Muscle weakness (generalized): Secondary | ICD-10-CM

## 2018-04-15 DIAGNOSIS — R278 Other lack of coordination: Secondary | ICD-10-CM

## 2018-04-15 DIAGNOSIS — R41841 Cognitive communication deficit: Secondary | ICD-10-CM | POA: Diagnosis not present
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
## 2018-04-16 ENCOUNTER — Other Ambulatory Visit: Payer: Self-pay | Admitting: Family Medicine

## 2018-04-16 DIAGNOSIS — N949 Unspecified condition associated with female genital organs and menstrual cycle: Secondary | ICD-10-CM

## 2018-04-17 ENCOUNTER — Ambulatory Visit: Payer: BLUE CROSS/BLUE SHIELD | Attending: Physical Medicine and Rehabilitation | Admitting: Occupational Therapy

## 2018-04-17 DIAGNOSIS — R41841 Cognitive communication deficit: Secondary | ICD-10-CM | POA: Insufficient documentation

## 2018-04-17 DIAGNOSIS — R490 Dysphonia: Secondary | ICD-10-CM | POA: Diagnosis not present

## 2018-04-17 DIAGNOSIS — M6281 Muscle weakness (generalized): Secondary | ICD-10-CM | POA: Diagnosis not present

## 2018-04-17 DIAGNOSIS — R2689 Other abnormalities of gait and mobility: Secondary | ICD-10-CM | POA: Insufficient documentation

## 2018-04-17 DIAGNOSIS — R2681 Unsteadiness on feet: Secondary | ICD-10-CM | POA: Diagnosis not present

## 2018-04-17 DIAGNOSIS — R278 Other lack of coordination: Secondary | ICD-10-CM | POA: Diagnosis not present

## 2018-04-18 ENCOUNTER — Ambulatory Visit: Payer: BLUE CROSS/BLUE SHIELD | Admitting: Speech Pathology

## 2018-04-18 DIAGNOSIS — R490 Dysphonia: Secondary | ICD-10-CM

## 2018-04-18 DIAGNOSIS — R2681 Unsteadiness on feet: Secondary | ICD-10-CM | POA: Diagnosis not present

## 2018-04-18 DIAGNOSIS — R2689 Other abnormalities of gait and mobility: Secondary | ICD-10-CM | POA: Diagnosis not present

## 2018-04-18 DIAGNOSIS — R41841 Cognitive communication deficit: Secondary | ICD-10-CM | POA: Diagnosis not present

## 2018-04-18 DIAGNOSIS — M6281 Muscle weakness (generalized): Secondary | ICD-10-CM | POA: Diagnosis not present

## 2018-04-18 DIAGNOSIS — R278 Other lack of coordination: Secondary | ICD-10-CM | POA: Diagnosis not present

## 2018-04-19 ENCOUNTER — Encounter: Payer: Self-pay | Admitting: Speech Pathology

## 2018-04-19 DIAGNOSIS — H527 Unspecified disorder of refraction: Secondary | ICD-10-CM | POA: Diagnosis not present

## 2018-04-19 DIAGNOSIS — S0285XD Fracture of orbit, unspecified, subsequent encounter for fracture with routine healing: Secondary | ICD-10-CM | POA: Diagnosis not present

## 2018-04-19 DIAGNOSIS — H468 Other optic neuritis: Secondary | ICD-10-CM | POA: Diagnosis not present
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
## 2018-04-21 NOTE — Addendum Note (Signed)
Addended by: Garlon Hatchet T on: 04/21/2018 01:07 PM   Modules accepted: Orders

## 2018-04-22 ENCOUNTER — Ambulatory Visit: Payer: BLUE CROSS/BLUE SHIELD | Admitting: Speech Pathology

## 2018-04-22 DIAGNOSIS — R2681 Unsteadiness on feet: Secondary | ICD-10-CM | POA: Diagnosis not present

## 2018-04-22 DIAGNOSIS — R490 Dysphonia: Secondary | ICD-10-CM

## 2018-04-22 DIAGNOSIS — R278 Other lack of coordination: Secondary | ICD-10-CM | POA: Diagnosis not present

## 2018-04-22 DIAGNOSIS — M6281 Muscle weakness (generalized): Secondary | ICD-10-CM | POA: Diagnosis not present

## 2018-04-22 DIAGNOSIS — R41841 Cognitive communication deficit: Secondary | ICD-10-CM

## 2018-04-22 DIAGNOSIS — R2689 Other abnormalities of gait and mobility: Secondary | ICD-10-CM | POA: Diagnosis not present

## 2018-04-23 ENCOUNTER — Encounter: Payer: Self-pay | Admitting: Occupational Therapy

## 2018-04-23 ENCOUNTER — Encounter: Payer: Self-pay | Admitting: Speech Pathology

## 2018-04-23 ENCOUNTER — Ambulatory Visit: Payer: BLUE CROSS/BLUE SHIELD | Admitting: Occupational Therapy

## 2018-04-23 DIAGNOSIS — R2681 Unsteadiness on feet: Secondary | ICD-10-CM | POA: Diagnosis not present

## 2018-04-23 DIAGNOSIS — M6281 Muscle weakness (generalized): Secondary | ICD-10-CM | POA: Diagnosis not present

## 2018-04-23 DIAGNOSIS — R278 Other lack of coordination: Secondary | ICD-10-CM | POA: Diagnosis not present

## 2018-04-23 DIAGNOSIS — R41841 Cognitive communication deficit: Secondary | ICD-10-CM | POA: Diagnosis not present

## 2018-04-23 DIAGNOSIS — R2689 Other abnormalities of gait and mobility: Secondary | ICD-10-CM | POA: Diagnosis not present

## 2018-04-23 DIAGNOSIS — R490 Dysphonia: Secondary | ICD-10-CM | POA: Diagnosis not present
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
## 2018-04-24 ENCOUNTER — Ambulatory Visit
Admission: RE | Admit: 2018-04-24 | Discharge: 2018-04-24 | Disposition: A | Discharge status: Home / Self Care | Payer: BLUE CROSS/BLUE SHIELD | Source: Ambulatory Visit | Attending: Family Medicine | Admitting: Family Medicine

## 2018-04-24 DIAGNOSIS — N838 Other noninflammatory disorders of ovary, fallopian tube and broad ligament: Secondary | ICD-10-CM | POA: Diagnosis not present

## 2018-04-24 DIAGNOSIS — N949 Unspecified condition associated with female genital organs and menstrual cycle: Secondary | ICD-10-CM

## 2018-04-25 ENCOUNTER — Encounter: Payer: Self-pay | Admitting: Occupational Therapy

## 2018-04-25 ENCOUNTER — Ambulatory Visit: Payer: BLUE CROSS/BLUE SHIELD | Admitting: Occupational Therapy

## 2018-04-25 ENCOUNTER — Ambulatory Visit: Payer: BLUE CROSS/BLUE SHIELD | Admitting: Physical Therapy

## 2018-04-25 DIAGNOSIS — R278 Other lack of coordination: Secondary | ICD-10-CM | POA: Diagnosis not present

## 2018-04-25 DIAGNOSIS — R2681 Unsteadiness on feet: Secondary | ICD-10-CM

## 2018-04-25 DIAGNOSIS — S06309A Unspecified focal traumatic brain injury with loss of consciousness of unspecified duration, initial encounter: Secondary | ICD-10-CM | POA: Diagnosis not present

## 2018-04-25 DIAGNOSIS — R41841 Cognitive communication deficit: Secondary | ICD-10-CM | POA: Diagnosis not present

## 2018-04-25 DIAGNOSIS — R490 Dysphonia: Secondary | ICD-10-CM | POA: Diagnosis not present

## 2018-04-25 DIAGNOSIS — M6281 Muscle weakness (generalized): Secondary | ICD-10-CM

## 2018-04-25 DIAGNOSIS — R2689 Other abnormalities of gait and mobility: Secondary | ICD-10-CM | POA: Diagnosis not present

## 2018-04-25 DIAGNOSIS — J342 Deviated nasal septum: Secondary | ICD-10-CM | POA: Diagnosis not present
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
## 2018-04-29 ENCOUNTER — Ambulatory Visit: Payer: BLUE CROSS/BLUE SHIELD | Admitting: Speech Pathology

## 2018-04-29 ENCOUNTER — Encounter: Payer: Self-pay | Admitting: Occupational Therapy

## 2018-04-29 ENCOUNTER — Ambulatory Visit
Admission: RE | Admit: 2018-04-29 | Discharge: 2018-04-29 | Disposition: A | Discharge status: Home / Self Care | Payer: Self-pay | Source: Ambulatory Visit | Attending: General Surgery | Admitting: General Surgery

## 2018-04-29 ENCOUNTER — Ambulatory Visit: Payer: BLUE CROSS/BLUE SHIELD | Admitting: Occupational Therapy

## 2018-04-29 ENCOUNTER — Other Ambulatory Visit: Payer: Self-pay | Admitting: General Surgery

## 2018-04-29 DIAGNOSIS — R41841 Cognitive communication deficit: Secondary | ICD-10-CM | POA: Diagnosis not present

## 2018-04-29 DIAGNOSIS — S3991XA Unspecified injury of abdomen, initial encounter: Secondary | ICD-10-CM

## 2018-04-29 DIAGNOSIS — R278 Other lack of coordination: Secondary | ICD-10-CM | POA: Diagnosis not present

## 2018-04-29 DIAGNOSIS — R2689 Other abnormalities of gait and mobility: Secondary | ICD-10-CM | POA: Diagnosis not present

## 2018-04-29 DIAGNOSIS — R490 Dysphonia: Secondary | ICD-10-CM | POA: Diagnosis not present

## 2018-04-29 DIAGNOSIS — M6281 Muscle weakness (generalized): Secondary | ICD-10-CM

## 2018-04-29 DIAGNOSIS — R2681 Unsteadiness on feet: Secondary | ICD-10-CM | POA: Diagnosis not present
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
## 2018-04-30 ENCOUNTER — Encounter: Payer: Self-pay | Admitting: Speech Pathology
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
## 2018-05-01 ENCOUNTER — Ambulatory Visit: Payer: BLUE CROSS/BLUE SHIELD | Admitting: Physical Therapy

## 2018-05-01 ENCOUNTER — Encounter: Payer: Self-pay | Admitting: Physical Therapy

## 2018-05-01 DIAGNOSIS — R41841 Cognitive communication deficit: Secondary | ICD-10-CM | POA: Diagnosis not present

## 2018-05-01 DIAGNOSIS — R2681 Unsteadiness on feet: Secondary | ICD-10-CM | POA: Diagnosis not present

## 2018-05-01 DIAGNOSIS — R2689 Other abnormalities of gait and mobility: Secondary | ICD-10-CM

## 2018-05-01 DIAGNOSIS — M6281 Muscle weakness (generalized): Secondary | ICD-10-CM | POA: Diagnosis not present

## 2018-05-01 DIAGNOSIS — R278 Other lack of coordination: Secondary | ICD-10-CM | POA: Diagnosis not present

## 2018-05-01 DIAGNOSIS — R490 Dysphonia: Secondary | ICD-10-CM | POA: Diagnosis not present

## 2018-05-01 NOTE — Patient Instructions (Signed)
Access Code: [email or web address]  URL: [email or web address]  Date: 05/01/2018  Prepared by: Blanche East   Exercises  Supine Bridge - 15 reps - 2 sets - 1x daily - 7x weekly  Alternating Single Leg Bridge - 15 reps - 2 sets - 1x daily - 7x weekly  Sidelying Hip Abduction - 15 reps - 2 sets - 1x daily - 7x weekly  Prone Hip Extension with Bent Knee - 15 reps - 2 sets - 1x daily - 7x weekly  Prone Hip Extension - 15 reps - 2 sets - 1x daily - 7x weekly  Tall Kneeling Hip Hinge - 15 reps - 2 sets - 1x daily - 7x weekly
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
## 2018-05-06 ENCOUNTER — Ambulatory Visit: Payer: BLUE CROSS/BLUE SHIELD | Admitting: Occupational Therapy

## 2018-05-06 ENCOUNTER — Ambulatory Visit: Payer: BLUE CROSS/BLUE SHIELD

## 2018-05-06 ENCOUNTER — Ambulatory Visit: Payer: BLUE CROSS/BLUE SHIELD | Admitting: Speech Pathology

## 2018-05-06 DIAGNOSIS — M6281 Muscle weakness (generalized): Secondary | ICD-10-CM

## 2018-05-06 DIAGNOSIS — R41841 Cognitive communication deficit: Secondary | ICD-10-CM | POA: Diagnosis not present

## 2018-05-06 DIAGNOSIS — R490 Dysphonia: Secondary | ICD-10-CM | POA: Diagnosis not present

## 2018-05-06 DIAGNOSIS — R278 Other lack of coordination: Secondary | ICD-10-CM

## 2018-05-06 DIAGNOSIS — R2689 Other abnormalities of gait and mobility: Secondary | ICD-10-CM | POA: Diagnosis not present

## 2018-05-06 DIAGNOSIS — R2681 Unsteadiness on feet: Secondary | ICD-10-CM
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
## 2018-05-07 ENCOUNTER — Encounter: Payer: Self-pay | Admitting: Speech Pathology

## 2018-05-07 DIAGNOSIS — S066X6D Traumatic subarachnoid hemorrhage with loss of consciousness greater than 24 hours without return to pre-existing conscious level with patient surviving, subsequent encounter: Secondary | ICD-10-CM | POA: Diagnosis not present
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
## 2018-05-08 ENCOUNTER — Encounter: Payer: Self-pay | Admitting: Occupational Therapy

## 2018-05-08 DIAGNOSIS — S42401D Unspecified fracture of lower end of right humerus, subsequent encounter for fracture with routine healing: Secondary | ICD-10-CM | POA: Diagnosis not present
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
## 2018-05-09 ENCOUNTER — Encounter: Payer: Self-pay | Admitting: Occupational Therapy

## 2018-05-09 ENCOUNTER — Ambulatory Visit: Payer: BLUE CROSS/BLUE SHIELD | Admitting: Speech Pathology

## 2018-05-09 ENCOUNTER — Ambulatory Visit: Payer: BLUE CROSS/BLUE SHIELD | Admitting: Occupational Therapy

## 2018-05-09 DIAGNOSIS — R278 Other lack of coordination: Secondary | ICD-10-CM | POA: Diagnosis not present

## 2018-05-09 DIAGNOSIS — M6281 Muscle weakness (generalized): Secondary | ICD-10-CM

## 2018-05-09 DIAGNOSIS — R490 Dysphonia: Secondary | ICD-10-CM

## 2018-05-09 DIAGNOSIS — R41841 Cognitive communication deficit: Secondary | ICD-10-CM

## 2018-05-09 DIAGNOSIS — R2681 Unsteadiness on feet: Secondary | ICD-10-CM | POA: Diagnosis not present

## 2018-05-09 DIAGNOSIS — R2689 Other abnormalities of gait and mobility: Secondary | ICD-10-CM | POA: Diagnosis not present
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
## 2018-05-10 ENCOUNTER — Encounter: Payer: Self-pay | Admitting: Speech Pathology
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
## 2018-05-14 ENCOUNTER — Ambulatory Visit: Payer: BLUE CROSS/BLUE SHIELD | Admitting: Physical Therapy

## 2018-05-14 ENCOUNTER — Encounter: Payer: Self-pay | Admitting: Speech Pathology

## 2018-05-14 ENCOUNTER — Ambulatory Visit: Payer: BLUE CROSS/BLUE SHIELD | Admitting: Speech Pathology

## 2018-05-14 ENCOUNTER — Encounter: Payer: Self-pay | Admitting: Physical Therapy

## 2018-05-14 DIAGNOSIS — R2689 Other abnormalities of gait and mobility: Secondary | ICD-10-CM

## 2018-05-14 DIAGNOSIS — R490 Dysphonia: Secondary | ICD-10-CM

## 2018-05-14 DIAGNOSIS — M6281 Muscle weakness (generalized): Secondary | ICD-10-CM | POA: Diagnosis not present

## 2018-05-14 DIAGNOSIS — R41841 Cognitive communication deficit: Secondary | ICD-10-CM | POA: Diagnosis not present

## 2018-05-14 DIAGNOSIS — R278 Other lack of coordination: Secondary | ICD-10-CM | POA: Diagnosis not present

## 2018-05-14 DIAGNOSIS — R2681 Unsteadiness on feet: Secondary | ICD-10-CM | POA: Diagnosis not present
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
## 2018-05-14 NOTE — Patient Instructions (Addendum)
Access Code: [email or web address]  URL: [email or web address]  Date: 05/14/2018  Prepared by: Blanche East   Exercises  Seated March with Resistance - 15 reps - 2 sets - 2x daily - 7x weekly  Seated Hip Abduction with Resistance - 15 reps - 2 sets - 2x daily - 7x weekly  Standing Hip Extension Kicks - 15 reps - 2 sets - 2x daily - 7x weekly   Access Code: [email or web address]  URL: [email or web address]  Date: 05/14/2018  Prepared by: Blanche East   Exercises  Hip Flexor Stretch at [facility] of Bed - 3 reps - 1 sets - 30 hold - 1x daily - 7x weekly  Sidelying Hip Flexor Stretch with Caregiver - 3 reps - 1 sets - 30 hold - 1x daily - 7x weekly

## 2018-05-16 ENCOUNTER — Encounter: Payer: Self-pay | Admitting: Speech Pathology

## 2018-05-16 ENCOUNTER — Ambulatory Visit: Payer: BLUE CROSS/BLUE SHIELD | Admitting: Speech Pathology

## 2018-05-16 ENCOUNTER — Ambulatory Visit: Payer: BLUE CROSS/BLUE SHIELD | Admitting: Occupational Therapy

## 2018-05-16 DIAGNOSIS — M6281 Muscle weakness (generalized): Secondary | ICD-10-CM

## 2018-05-16 DIAGNOSIS — R278 Other lack of coordination: Secondary | ICD-10-CM | POA: Diagnosis not present

## 2018-05-16 DIAGNOSIS — R2689 Other abnormalities of gait and mobility: Secondary | ICD-10-CM | POA: Diagnosis not present

## 2018-05-16 DIAGNOSIS — R41841 Cognitive communication deficit: Secondary | ICD-10-CM

## 2018-05-16 DIAGNOSIS — R490 Dysphonia: Secondary | ICD-10-CM

## 2018-05-16 DIAGNOSIS — R2681 Unsteadiness on feet: Secondary | ICD-10-CM | POA: Diagnosis not present
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
## 2018-05-17 ENCOUNTER — Ambulatory Visit: Payer: BLUE CROSS/BLUE SHIELD | Attending: Physical Medicine and Rehabilitation

## 2018-05-17 ENCOUNTER — Ambulatory Visit: Payer: BLUE CROSS/BLUE SHIELD | Admitting: Occupational Therapy

## 2018-05-17 DIAGNOSIS — R490 Dysphonia: Secondary | ICD-10-CM | POA: Insufficient documentation

## 2018-05-17 DIAGNOSIS — R2681 Unsteadiness on feet: Secondary | ICD-10-CM

## 2018-05-17 DIAGNOSIS — M6281 Muscle weakness (generalized): Secondary | ICD-10-CM

## 2018-05-17 DIAGNOSIS — R278 Other lack of coordination: Secondary | ICD-10-CM | POA: Diagnosis not present

## 2018-05-17 DIAGNOSIS — R41841 Cognitive communication deficit: Secondary | ICD-10-CM | POA: Diagnosis not present

## 2018-05-17 DIAGNOSIS — R2689 Other abnormalities of gait and mobility: Secondary | ICD-10-CM | POA: Diagnosis not present
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
## 2018-05-20 ENCOUNTER — Encounter: Payer: Self-pay | Admitting: Occupational Therapy

## 2018-05-20 ENCOUNTER — Ambulatory Visit: Payer: BLUE CROSS/BLUE SHIELD | Admitting: Occupational Therapy

## 2018-05-20 DIAGNOSIS — R490 Dysphonia: Secondary | ICD-10-CM | POA: Diagnosis not present

## 2018-05-20 DIAGNOSIS — M6281 Muscle weakness (generalized): Secondary | ICD-10-CM

## 2018-05-20 DIAGNOSIS — R41841 Cognitive communication deficit: Secondary | ICD-10-CM | POA: Diagnosis not present

## 2018-05-20 DIAGNOSIS — R2689 Other abnormalities of gait and mobility: Secondary | ICD-10-CM | POA: Diagnosis not present

## 2018-05-20 DIAGNOSIS — R2681 Unsteadiness on feet: Secondary | ICD-10-CM | POA: Diagnosis not present

## 2018-05-20 DIAGNOSIS — R278 Other lack of coordination: Secondary | ICD-10-CM | POA: Diagnosis not present
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
## 2018-05-21 ENCOUNTER — Ambulatory Visit: Payer: BLUE CROSS/BLUE SHIELD | Admitting: Speech Pathology

## 2018-05-21 ENCOUNTER — Ambulatory Visit: Payer: BLUE CROSS/BLUE SHIELD | Admitting: Occupational Therapy

## 2018-05-21 ENCOUNTER — Ambulatory Visit: Payer: BLUE CROSS/BLUE SHIELD

## 2018-05-21 ENCOUNTER — Encounter: Payer: Self-pay | Admitting: Speech Pathology

## 2018-05-21 DIAGNOSIS — M6281 Muscle weakness (generalized): Secondary | ICD-10-CM

## 2018-05-21 DIAGNOSIS — R41841 Cognitive communication deficit: Secondary | ICD-10-CM | POA: Diagnosis not present

## 2018-05-21 DIAGNOSIS — R490 Dysphonia: Secondary | ICD-10-CM | POA: Diagnosis not present

## 2018-05-21 DIAGNOSIS — R278 Other lack of coordination: Secondary | ICD-10-CM

## 2018-05-21 DIAGNOSIS — R2681 Unsteadiness on feet: Secondary | ICD-10-CM | POA: Diagnosis not present

## 2018-05-21 DIAGNOSIS — R2689 Other abnormalities of gait and mobility: Secondary | ICD-10-CM | POA: Diagnosis not present
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
## 2018-05-23 ENCOUNTER — Encounter: Payer: Self-pay | Admitting: Speech Pathology

## 2018-05-23 ENCOUNTER — Ambulatory Visit: Payer: BLUE CROSS/BLUE SHIELD | Admitting: Physical Therapy

## 2018-05-23 ENCOUNTER — Ambulatory Visit: Payer: BLUE CROSS/BLUE SHIELD | Admitting: Speech Pathology

## 2018-05-23 ENCOUNTER — Encounter: Payer: Self-pay | Admitting: Physical Therapy

## 2018-05-23 ENCOUNTER — Ambulatory Visit: Payer: BLUE CROSS/BLUE SHIELD | Admitting: Occupational Therapy

## 2018-05-23 DIAGNOSIS — R41841 Cognitive communication deficit: Secondary | ICD-10-CM

## 2018-05-23 DIAGNOSIS — R2689 Other abnormalities of gait and mobility: Secondary | ICD-10-CM

## 2018-05-23 DIAGNOSIS — R490 Dysphonia: Secondary | ICD-10-CM

## 2018-05-23 DIAGNOSIS — M6281 Muscle weakness (generalized): Secondary | ICD-10-CM

## 2018-05-23 DIAGNOSIS — R278 Other lack of coordination: Secondary | ICD-10-CM

## 2018-05-23 DIAGNOSIS — R2681 Unsteadiness on feet: Secondary | ICD-10-CM | POA: Diagnosis not present
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
## 2018-05-25 ENCOUNTER — Encounter: Payer: Self-pay | Admitting: Occupational Therapy
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
## 2018-05-27 DIAGNOSIS — S069X4D Unspecified intracranial injury with loss of consciousness of 6 hours to 24 hours, subsequent encounter: Secondary | ICD-10-CM | POA: Diagnosis not present

## 2018-05-28 ENCOUNTER — Ambulatory Visit: Payer: BLUE CROSS/BLUE SHIELD | Admitting: Speech Pathology

## 2018-05-28 ENCOUNTER — Encounter: Payer: Self-pay | Admitting: Speech Pathology

## 2018-05-28 DIAGNOSIS — R490 Dysphonia: Secondary | ICD-10-CM

## 2018-05-28 DIAGNOSIS — R2689 Other abnormalities of gait and mobility: Secondary | ICD-10-CM | POA: Diagnosis not present

## 2018-05-28 DIAGNOSIS — R41841 Cognitive communication deficit: Secondary | ICD-10-CM | POA: Diagnosis not present

## 2018-05-28 DIAGNOSIS — R278 Other lack of coordination: Secondary | ICD-10-CM | POA: Diagnosis not present

## 2018-05-28 DIAGNOSIS — R2681 Unsteadiness on feet: Secondary | ICD-10-CM | POA: Diagnosis not present

## 2018-05-28 DIAGNOSIS — M6281 Muscle weakness (generalized): Secondary | ICD-10-CM | POA: Diagnosis not present
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
## 2018-05-29 ENCOUNTER — Ambulatory Visit: Payer: BLUE CROSS/BLUE SHIELD | Admitting: Occupational Therapy

## 2018-05-29 ENCOUNTER — Encounter: Payer: Self-pay | Admitting: Occupational Therapy

## 2018-05-29 DIAGNOSIS — R2689 Other abnormalities of gait and mobility: Secondary | ICD-10-CM | POA: Diagnosis not present

## 2018-05-29 DIAGNOSIS — M6281 Muscle weakness (generalized): Secondary | ICD-10-CM

## 2018-05-29 DIAGNOSIS — R41841 Cognitive communication deficit: Secondary | ICD-10-CM | POA: Diagnosis not present

## 2018-05-29 DIAGNOSIS — R278 Other lack of coordination: Secondary | ICD-10-CM | POA: Diagnosis not present

## 2018-05-29 DIAGNOSIS — R2681 Unsteadiness on feet: Secondary | ICD-10-CM | POA: Diagnosis not present

## 2018-05-29 DIAGNOSIS — R490 Dysphonia: Secondary | ICD-10-CM | POA: Diagnosis not present
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
## 2018-05-30 ENCOUNTER — Other Ambulatory Visit: Payer: Self-pay | Admitting: Family Medicine

## 2018-05-30 ENCOUNTER — Ambulatory Visit: Payer: BLUE CROSS/BLUE SHIELD

## 2018-05-30 ENCOUNTER — Ambulatory Visit: Payer: BLUE CROSS/BLUE SHIELD | Admitting: Occupational Therapy

## 2018-05-30 ENCOUNTER — Ambulatory Visit: Payer: BLUE CROSS/BLUE SHIELD | Admitting: Speech Pathology

## 2018-05-30 DIAGNOSIS — R2681 Unsteadiness on feet: Secondary | ICD-10-CM | POA: Diagnosis not present

## 2018-05-30 DIAGNOSIS — R41841 Cognitive communication deficit: Secondary | ICD-10-CM | POA: Diagnosis not present

## 2018-05-30 DIAGNOSIS — R490 Dysphonia: Secondary | ICD-10-CM

## 2018-05-30 DIAGNOSIS — R2689 Other abnormalities of gait and mobility: Secondary | ICD-10-CM

## 2018-05-30 DIAGNOSIS — M6281 Muscle weakness (generalized): Secondary | ICD-10-CM

## 2018-05-30 DIAGNOSIS — R278 Other lack of coordination: Secondary | ICD-10-CM | POA: Diagnosis not present

## 2018-05-30 DIAGNOSIS — N949 Unspecified condition associated with female genital organs and menstrual cycle: Secondary | ICD-10-CM
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
## 2018-05-31 ENCOUNTER — Encounter: Payer: Self-pay | Admitting: Speech Pathology
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
## 2018-06-03 ENCOUNTER — Ambulatory Visit: Payer: BLUE CROSS/BLUE SHIELD | Admitting: Occupational Therapy

## 2018-06-03 ENCOUNTER — Encounter: Payer: Self-pay | Admitting: Occupational Therapy

## 2018-06-03 ENCOUNTER — Ambulatory Visit: Payer: BLUE CROSS/BLUE SHIELD

## 2018-06-03 ENCOUNTER — Ambulatory Visit: Payer: BLUE CROSS/BLUE SHIELD | Admitting: Speech Pathology

## 2018-06-03 DIAGNOSIS — R41841 Cognitive communication deficit: Secondary | ICD-10-CM

## 2018-06-03 DIAGNOSIS — R490 Dysphonia: Secondary | ICD-10-CM

## 2018-06-03 DIAGNOSIS — R2681 Unsteadiness on feet: Secondary | ICD-10-CM | POA: Diagnosis not present

## 2018-06-03 DIAGNOSIS — M6281 Muscle weakness (generalized): Secondary | ICD-10-CM

## 2018-06-03 DIAGNOSIS — R2689 Other abnormalities of gait and mobility: Secondary | ICD-10-CM | POA: Diagnosis not present

## 2018-06-03 DIAGNOSIS — R278 Other lack of coordination: Secondary | ICD-10-CM | POA: Diagnosis not present
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
## 2018-06-04 ENCOUNTER — Encounter: Payer: Self-pay | Admitting: Speech Pathology
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
## 2018-06-05 ENCOUNTER — Ambulatory Visit: Payer: BLUE CROSS/BLUE SHIELD | Admitting: Occupational Therapy

## 2018-06-05 ENCOUNTER — Ambulatory Visit: Payer: BLUE CROSS/BLUE SHIELD

## 2018-06-05 ENCOUNTER — Ambulatory Visit: Payer: BLUE CROSS/BLUE SHIELD | Admitting: Speech Pathology

## 2018-06-06 ENCOUNTER — Encounter: Payer: Self-pay | Admitting: Occupational Therapy

## 2018-06-06 ENCOUNTER — Ambulatory Visit: Payer: BLUE CROSS/BLUE SHIELD | Admitting: Occupational Therapy

## 2018-06-06 DIAGNOSIS — R490 Dysphonia: Secondary | ICD-10-CM | POA: Diagnosis not present

## 2018-06-06 DIAGNOSIS — M6281 Muscle weakness (generalized): Secondary | ICD-10-CM | POA: Diagnosis not present

## 2018-06-06 DIAGNOSIS — R2689 Other abnormalities of gait and mobility: Secondary | ICD-10-CM | POA: Diagnosis not present

## 2018-06-06 DIAGNOSIS — R278 Other lack of coordination: Secondary | ICD-10-CM | POA: Diagnosis not present

## 2018-06-06 DIAGNOSIS — R2681 Unsteadiness on feet: Secondary | ICD-10-CM | POA: Diagnosis not present

## 2018-06-06 DIAGNOSIS — R41841 Cognitive communication deficit: Secondary | ICD-10-CM | POA: Diagnosis not present
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
## 2018-06-07 ENCOUNTER — Ambulatory Visit
Admission: RE | Admit: 2018-06-07 | Discharge: 2018-06-07 | Disposition: A | Discharge status: Home / Self Care | Payer: BLUE CROSS/BLUE SHIELD | Source: Ambulatory Visit | Attending: Family Medicine | Admitting: Family Medicine

## 2018-06-07 DIAGNOSIS — N83291 Other ovarian cyst, right side: Secondary | ICD-10-CM | POA: Diagnosis not present

## 2018-06-07 DIAGNOSIS — N949 Unspecified condition associated with female genital organs and menstrual cycle: Secondary | ICD-10-CM

## 2018-06-10 ENCOUNTER — Ambulatory Visit: Payer: BLUE CROSS/BLUE SHIELD

## 2018-06-10 ENCOUNTER — Encounter: Payer: Self-pay | Admitting: Speech Pathology

## 2018-06-10 ENCOUNTER — Ambulatory Visit: Payer: BLUE CROSS/BLUE SHIELD | Admitting: Speech Pathology

## 2018-06-10 ENCOUNTER — Ambulatory Visit: Payer: BLUE CROSS/BLUE SHIELD | Admitting: Occupational Therapy

## 2018-06-10 DIAGNOSIS — R41841 Cognitive communication deficit: Secondary | ICD-10-CM

## 2018-06-10 DIAGNOSIS — R278 Other lack of coordination: Secondary | ICD-10-CM | POA: Diagnosis not present

## 2018-06-10 DIAGNOSIS — R2681 Unsteadiness on feet: Secondary | ICD-10-CM

## 2018-06-10 DIAGNOSIS — R2689 Other abnormalities of gait and mobility: Secondary | ICD-10-CM

## 2018-06-10 DIAGNOSIS — M6281 Muscle weakness (generalized): Secondary | ICD-10-CM | POA: Diagnosis not present

## 2018-06-10 DIAGNOSIS — R490 Dysphonia: Secondary | ICD-10-CM | POA: Diagnosis not present
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
## 2018-06-11 ENCOUNTER — Encounter: Payer: Self-pay | Admitting: Occupational Therapy

## 2018-06-11 ENCOUNTER — Ambulatory Visit: Payer: BLUE CROSS/BLUE SHIELD | Admitting: Occupational Therapy

## 2018-06-11 DIAGNOSIS — R490 Dysphonia: Secondary | ICD-10-CM | POA: Diagnosis not present

## 2018-06-11 DIAGNOSIS — R2681 Unsteadiness on feet: Secondary | ICD-10-CM | POA: Diagnosis not present

## 2018-06-11 DIAGNOSIS — R2689 Other abnormalities of gait and mobility: Secondary | ICD-10-CM | POA: Diagnosis not present

## 2018-06-11 DIAGNOSIS — R278 Other lack of coordination: Secondary | ICD-10-CM | POA: Diagnosis not present

## 2018-06-11 DIAGNOSIS — M6281 Muscle weakness (generalized): Secondary | ICD-10-CM | POA: Diagnosis not present

## 2018-06-11 DIAGNOSIS — R41841 Cognitive communication deficit: Secondary | ICD-10-CM | POA: Diagnosis not present
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
## 2018-06-12 ENCOUNTER — Ambulatory Visit: Payer: BLUE CROSS/BLUE SHIELD | Admitting: Occupational Therapy

## 2018-06-12 ENCOUNTER — Encounter: Payer: Self-pay | Admitting: Occupational Therapy

## 2018-06-12 ENCOUNTER — Ambulatory Visit: Payer: BLUE CROSS/BLUE SHIELD | Admitting: Speech Pathology

## 2018-06-12 DIAGNOSIS — M6281 Muscle weakness (generalized): Secondary | ICD-10-CM | POA: Diagnosis not present

## 2018-06-12 DIAGNOSIS — R2689 Other abnormalities of gait and mobility: Secondary | ICD-10-CM | POA: Diagnosis not present

## 2018-06-12 DIAGNOSIS — R41841 Cognitive communication deficit: Secondary | ICD-10-CM | POA: Diagnosis not present

## 2018-06-12 DIAGNOSIS — R490 Dysphonia: Secondary | ICD-10-CM | POA: Diagnosis not present

## 2018-06-12 DIAGNOSIS — R2681 Unsteadiness on feet: Secondary | ICD-10-CM | POA: Diagnosis not present

## 2018-06-12 DIAGNOSIS — R278 Other lack of coordination: Secondary | ICD-10-CM | POA: Diagnosis not present
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
## 2018-06-14 DIAGNOSIS — I7771 Dissection of carotid artery: Secondary | ICD-10-CM | POA: Diagnosis not present

## 2018-06-17 ENCOUNTER — Ambulatory Visit: Payer: BLUE CROSS/BLUE SHIELD | Attending: Physical Medicine and Rehabilitation | Admitting: Occupational Therapy

## 2018-06-17 ENCOUNTER — Ambulatory Visit: Payer: BLUE CROSS/BLUE SHIELD

## 2018-06-17 DIAGNOSIS — R2689 Other abnormalities of gait and mobility: Secondary | ICD-10-CM | POA: Diagnosis not present

## 2018-06-17 DIAGNOSIS — M6281 Muscle weakness (generalized): Secondary | ICD-10-CM

## 2018-06-17 DIAGNOSIS — R2681 Unsteadiness on feet: Secondary | ICD-10-CM

## 2018-06-17 DIAGNOSIS — R278 Other lack of coordination: Secondary | ICD-10-CM | POA: Insufficient documentation
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
## 2018-06-18 ENCOUNTER — Other Ambulatory Visit (HOSPITAL_COMMUNITY)
Admission: RE | Admit: 2018-06-18 | Discharge: 2018-06-18 | Disposition: A | Discharge status: Home / Self Care | Payer: BLUE CROSS/BLUE SHIELD | Source: Ambulatory Visit | Attending: Family Medicine | Admitting: Family Medicine

## 2018-06-18 ENCOUNTER — Other Ambulatory Visit: Payer: Self-pay | Admitting: Family Medicine

## 2018-06-18 DIAGNOSIS — F411 Generalized anxiety disorder: Secondary | ICD-10-CM | POA: Diagnosis not present

## 2018-06-18 DIAGNOSIS — Z87820 Personal history of traumatic brain injury: Secondary | ICD-10-CM | POA: Diagnosis not present

## 2018-06-18 DIAGNOSIS — Z0000 Encounter for general adult medical examination without abnormal findings: Secondary | ICD-10-CM | POA: Diagnosis not present

## 2018-06-18 DIAGNOSIS — Z113 Encounter for screening for infections with a predominantly sexual mode of transmission: Secondary | ICD-10-CM | POA: Diagnosis not present

## 2018-06-18 DIAGNOSIS — M24522 Contracture, left elbow: Secondary | ICD-10-CM | POA: Diagnosis not present

## 2018-06-18 DIAGNOSIS — Z124 Encounter for screening for malignant neoplasm of cervix: Secondary | ICD-10-CM | POA: Diagnosis not present

## 2018-06-18 DIAGNOSIS — Z13220 Encounter for screening for lipoid disorders: Secondary | ICD-10-CM | POA: Diagnosis not present

## 2018-06-18 DIAGNOSIS — M25551 Pain in right hip: Secondary | ICD-10-CM | POA: Diagnosis not present

## 2018-06-18 DIAGNOSIS — Z23 Encounter for immunization: Secondary | ICD-10-CM | POA: Diagnosis not present

## 2018-06-18 LAB — LIPID PANEL
Cholesterol: 259 — AB (ref 0–200)
HDL: 57 (ref 35–70)
LDL Cholesterol: 169
LDl/HDL Ratio: 4.5
Triglycerides: 164 — AB (ref 40–160)

## 2018-06-18 LAB — T4, FREE: Free T4: 0.88

## 2018-06-18 LAB — HM HIV SCREENING LAB: HM HIV Screening: NEGATIVE

## 2018-06-18 LAB — RPR: RPR: NEGATIVE

## 2018-06-18 LAB — TSH: TSH: 5.4 (ref 0.41–5.90)

## 2018-06-18 LAB — HM HEPATITIS C SCREENING LAB: HM Hepatitis Screen: NEGATIVE

## 2018-06-19 ENCOUNTER — Ambulatory Visit: Payer: BLUE CROSS/BLUE SHIELD | Admitting: Occupational Therapy

## 2018-06-19 ENCOUNTER — Encounter: Payer: Self-pay | Admitting: Occupational Therapy

## 2018-06-19 DIAGNOSIS — R2681 Unsteadiness on feet: Secondary | ICD-10-CM | POA: Diagnosis not present

## 2018-06-19 DIAGNOSIS — M6281 Muscle weakness (generalized): Secondary | ICD-10-CM | POA: Diagnosis not present

## 2018-06-19 DIAGNOSIS — R278 Other lack of coordination: Secondary | ICD-10-CM

## 2018-06-19 DIAGNOSIS — R2689 Other abnormalities of gait and mobility: Secondary | ICD-10-CM | POA: Diagnosis not present

## 2018-06-19 LAB — CYTOLOGY - PAP: Diagnosis: NEGATIVE
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
## 2018-06-20 ENCOUNTER — Ambulatory Visit: Payer: BLUE CROSS/BLUE SHIELD | Admitting: Occupational Therapy

## 2018-06-20 ENCOUNTER — Ambulatory Visit: Payer: BLUE CROSS/BLUE SHIELD | Admitting: Physical Therapy

## 2018-06-20 DIAGNOSIS — M6281 Muscle weakness (generalized): Secondary | ICD-10-CM | POA: Diagnosis not present

## 2018-06-20 DIAGNOSIS — R2681 Unsteadiness on feet: Secondary | ICD-10-CM

## 2018-06-20 DIAGNOSIS — R278 Other lack of coordination: Secondary | ICD-10-CM | POA: Diagnosis not present

## 2018-06-20 DIAGNOSIS — R2689 Other abnormalities of gait and mobility: Secondary | ICD-10-CM
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
## 2018-06-24 ENCOUNTER — Ambulatory Visit: Payer: BLUE CROSS/BLUE SHIELD

## 2018-06-24 ENCOUNTER — Ambulatory Visit: Payer: BLUE CROSS/BLUE SHIELD | Admitting: Occupational Therapy

## 2018-06-25 DIAGNOSIS — Z309 Encounter for contraceptive management, unspecified: Secondary | ICD-10-CM | POA: Diagnosis not present

## 2018-06-25 DIAGNOSIS — N949 Unspecified condition associated with female genital organs and menstrual cycle: Secondary | ICD-10-CM | POA: Diagnosis not present

## 2018-06-26 ENCOUNTER — Ambulatory Visit: Payer: BLUE CROSS/BLUE SHIELD | Admitting: Occupational Therapy

## 2018-06-26 VITALS — BP 115/76 | HR 97 | Temp 98.5°F | Resp 19

## 2018-06-26 DIAGNOSIS — M6281 Muscle weakness (generalized): Secondary | ICD-10-CM | POA: Diagnosis not present

## 2018-06-26 DIAGNOSIS — R2689 Other abnormalities of gait and mobility: Secondary | ICD-10-CM | POA: Diagnosis not present

## 2018-06-26 DIAGNOSIS — R278 Other lack of coordination: Secondary | ICD-10-CM | POA: Diagnosis not present

## 2018-06-26 DIAGNOSIS — R2681 Unsteadiness on feet: Secondary | ICD-10-CM | POA: Diagnosis not present

## 2018-06-26 DIAGNOSIS — M25351 Other instability, right hip: Secondary | ICD-10-CM | POA: Diagnosis not present
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
## 2018-06-27 ENCOUNTER — Encounter: Payer: Self-pay | Admitting: Physical Therapy

## 2018-06-27 ENCOUNTER — Ambulatory Visit: Payer: BLUE CROSS/BLUE SHIELD | Admitting: Physical Therapy

## 2018-06-27 ENCOUNTER — Ambulatory Visit: Payer: BLUE CROSS/BLUE SHIELD | Admitting: Occupational Therapy

## 2018-06-27 ENCOUNTER — Encounter: Payer: Self-pay | Admitting: Occupational Therapy

## 2018-06-27 DIAGNOSIS — M6281 Muscle weakness (generalized): Secondary | ICD-10-CM

## 2018-06-27 DIAGNOSIS — R278 Other lack of coordination: Secondary | ICD-10-CM

## 2018-06-27 DIAGNOSIS — R2689 Other abnormalities of gait and mobility: Secondary | ICD-10-CM | POA: Diagnosis not present

## 2018-06-27 DIAGNOSIS — R2681 Unsteadiness on feet: Secondary | ICD-10-CM | POA: Diagnosis not present
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
## 2018-07-01 ENCOUNTER — Ambulatory Visit: Payer: BLUE CROSS/BLUE SHIELD | Admitting: Occupational Therapy

## 2018-07-01 DIAGNOSIS — R278 Other lack of coordination: Secondary | ICD-10-CM | POA: Diagnosis not present

## 2018-07-01 DIAGNOSIS — M6281 Muscle weakness (generalized): Secondary | ICD-10-CM | POA: Diagnosis not present

## 2018-07-01 DIAGNOSIS — R2689 Other abnormalities of gait and mobility: Secondary | ICD-10-CM | POA: Diagnosis not present

## 2018-07-01 DIAGNOSIS — R2681 Unsteadiness on feet: Secondary | ICD-10-CM | POA: Diagnosis not present
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
## 2018-07-02 ENCOUNTER — Ambulatory Visit: Payer: BLUE CROSS/BLUE SHIELD | Admitting: Occupational Therapy

## 2018-07-02 ENCOUNTER — Encounter: Payer: Self-pay | Admitting: Occupational Therapy

## 2018-07-02 ENCOUNTER — Ambulatory Visit: Payer: BLUE CROSS/BLUE SHIELD

## 2018-07-02 DIAGNOSIS — M6281 Muscle weakness (generalized): Secondary | ICD-10-CM

## 2018-07-02 DIAGNOSIS — R2681 Unsteadiness on feet: Secondary | ICD-10-CM | POA: Diagnosis not present

## 2018-07-02 DIAGNOSIS — R278 Other lack of coordination: Secondary | ICD-10-CM | POA: Diagnosis not present

## 2018-07-02 DIAGNOSIS — R2689 Other abnormalities of gait and mobility: Secondary | ICD-10-CM | POA: Diagnosis not present
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
## 2018-07-03 ENCOUNTER — Ambulatory Visit: Payer: BLUE CROSS/BLUE SHIELD | Admitting: Occupational Therapy

## 2018-07-03 DIAGNOSIS — R2681 Unsteadiness on feet: Secondary | ICD-10-CM | POA: Diagnosis not present

## 2018-07-03 DIAGNOSIS — R2689 Other abnormalities of gait and mobility: Secondary | ICD-10-CM | POA: Diagnosis not present

## 2018-07-03 DIAGNOSIS — M6281 Muscle weakness (generalized): Secondary | ICD-10-CM | POA: Diagnosis not present

## 2018-07-03 DIAGNOSIS — R278 Other lack of coordination: Secondary | ICD-10-CM | POA: Diagnosis not present
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
## 2018-07-04 ENCOUNTER — Ambulatory Visit: Payer: BLUE CROSS/BLUE SHIELD

## 2018-07-04 ENCOUNTER — Ambulatory Visit: Payer: BLUE CROSS/BLUE SHIELD | Admitting: Occupational Therapy

## 2018-07-04 DIAGNOSIS — Z3202 Encounter for pregnancy test, result negative: Secondary | ICD-10-CM | POA: Diagnosis not present

## 2018-07-04 DIAGNOSIS — Z308 Encounter for other contraceptive management: Secondary | ICD-10-CM | POA: Diagnosis not present

## 2018-07-08 ENCOUNTER — Encounter: Payer: Self-pay | Admitting: Occupational Therapy

## 2018-07-08 ENCOUNTER — Ambulatory Visit: Payer: BLUE CROSS/BLUE SHIELD | Admitting: Occupational Therapy

## 2018-07-08 DIAGNOSIS — R2689 Other abnormalities of gait and mobility: Secondary | ICD-10-CM | POA: Diagnosis not present

## 2018-07-08 DIAGNOSIS — M6281 Muscle weakness (generalized): Secondary | ICD-10-CM | POA: Diagnosis not present

## 2018-07-08 DIAGNOSIS — R278 Other lack of coordination: Secondary | ICD-10-CM | POA: Diagnosis not present

## 2018-07-08 DIAGNOSIS — R2681 Unsteadiness on feet: Secondary | ICD-10-CM | POA: Diagnosis not present
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
## 2018-07-11 ENCOUNTER — Ambulatory Visit: Payer: BLUE CROSS/BLUE SHIELD

## 2018-07-11 ENCOUNTER — Encounter: Payer: Self-pay | Admitting: Occupational Therapy

## 2018-07-11 ENCOUNTER — Ambulatory Visit: Payer: BLUE CROSS/BLUE SHIELD | Admitting: Occupational Therapy

## 2018-07-11 DIAGNOSIS — M6281 Muscle weakness (generalized): Secondary | ICD-10-CM | POA: Diagnosis not present

## 2018-07-11 DIAGNOSIS — R278 Other lack of coordination: Secondary | ICD-10-CM | POA: Diagnosis not present

## 2018-07-11 DIAGNOSIS — R2689 Other abnormalities of gait and mobility: Secondary | ICD-10-CM

## 2018-07-11 DIAGNOSIS — R2681 Unsteadiness on feet: Secondary | ICD-10-CM | POA: Diagnosis not present
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
## 2018-07-15 ENCOUNTER — Ambulatory Visit: Payer: BLUE CROSS/BLUE SHIELD | Admitting: Occupational Therapy

## 2018-07-15 DIAGNOSIS — M25551 Pain in right hip: Secondary | ICD-10-CM | POA: Diagnosis not present

## 2018-07-15 DIAGNOSIS — M25351 Other instability, right hip: Secondary | ICD-10-CM | POA: Diagnosis not present

## 2018-07-16 ENCOUNTER — Ambulatory Visit: Payer: BLUE CROSS/BLUE SHIELD | Admitting: Physical Therapy

## 2018-07-16 ENCOUNTER — Encounter: Payer: Self-pay | Admitting: Physical Therapy

## 2018-07-16 ENCOUNTER — Ambulatory Visit: Payer: BLUE CROSS/BLUE SHIELD | Admitting: Occupational Therapy

## 2018-07-16 DIAGNOSIS — R278 Other lack of coordination: Secondary | ICD-10-CM | POA: Diagnosis not present

## 2018-07-16 DIAGNOSIS — M6281 Muscle weakness (generalized): Secondary | ICD-10-CM | POA: Diagnosis not present

## 2018-07-16 DIAGNOSIS — R2681 Unsteadiness on feet: Secondary | ICD-10-CM | POA: Diagnosis not present

## 2018-07-16 DIAGNOSIS — R2689 Other abnormalities of gait and mobility: Secondary | ICD-10-CM | POA: Diagnosis not present
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
## 2018-07-17 HISTORY — PX: HIP ARTHROSCOPY: SUR88

## 2018-07-18 ENCOUNTER — Ambulatory Visit: Payer: Managed Care, Other (non HMO) | Admitting: Occupational Therapy

## 2018-07-18 ENCOUNTER — Ambulatory Visit: Payer: Managed Care, Other (non HMO) | Attending: Physical Medicine and Rehabilitation

## 2018-07-18 ENCOUNTER — Encounter: Payer: Self-pay | Admitting: Occupational Therapy

## 2018-07-18 DIAGNOSIS — R2681 Unsteadiness on feet: Secondary | ICD-10-CM | POA: Insufficient documentation

## 2018-07-18 DIAGNOSIS — R278 Other lack of coordination: Secondary | ICD-10-CM

## 2018-07-18 DIAGNOSIS — R2689 Other abnormalities of gait and mobility: Secondary | ICD-10-CM | POA: Insufficient documentation

## 2018-07-18 DIAGNOSIS — M6281 Muscle weakness (generalized): Secondary | ICD-10-CM
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
## 2018-07-19 ENCOUNTER — Ambulatory Visit: Payer: Managed Care, Other (non HMO) | Admitting: Occupational Therapy

## 2018-07-19 DIAGNOSIS — R278 Other lack of coordination: Secondary | ICD-10-CM | POA: Diagnosis not present

## 2018-07-19 DIAGNOSIS — M6281 Muscle weakness (generalized): Secondary | ICD-10-CM | POA: Diagnosis not present

## 2018-07-19 DIAGNOSIS — R2689 Other abnormalities of gait and mobility: Secondary | ICD-10-CM | POA: Diagnosis not present

## 2018-07-19 DIAGNOSIS — R2681 Unsteadiness on feet: Secondary | ICD-10-CM | POA: Diagnosis not present
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
## 2018-07-20 ENCOUNTER — Encounter: Payer: Self-pay | Admitting: Occupational Therapy

## 2018-07-22 ENCOUNTER — Ambulatory Visit: Payer: Managed Care, Other (non HMO) | Admitting: Physical Therapy

## 2018-07-22 DIAGNOSIS — R2689 Other abnormalities of gait and mobility: Secondary | ICD-10-CM

## 2018-07-22 DIAGNOSIS — R278 Other lack of coordination: Secondary | ICD-10-CM

## 2018-07-22 DIAGNOSIS — M6281 Muscle weakness (generalized): Secondary | ICD-10-CM | POA: Diagnosis not present

## 2018-07-22 DIAGNOSIS — S066X6D Traumatic subarachnoid hemorrhage with loss of consciousness greater than 24 hours without return to pre-existing conscious level with patient surviving, subsequent encounter: Secondary | ICD-10-CM | POA: Diagnosis not present

## 2018-07-22 DIAGNOSIS — R2681 Unsteadiness on feet: Secondary | ICD-10-CM | POA: Diagnosis not present
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
## 2018-07-24 ENCOUNTER — Ambulatory Visit: Payer: Managed Care, Other (non HMO)

## 2018-07-24 ENCOUNTER — Ambulatory Visit: Payer: Managed Care, Other (non HMO) | Admitting: Occupational Therapy

## 2018-07-24 DIAGNOSIS — R2689 Other abnormalities of gait and mobility: Secondary | ICD-10-CM | POA: Diagnosis not present

## 2018-07-24 DIAGNOSIS — M6281 Muscle weakness (generalized): Secondary | ICD-10-CM | POA: Diagnosis not present

## 2018-07-24 DIAGNOSIS — R2681 Unsteadiness on feet: Secondary | ICD-10-CM | POA: Diagnosis not present

## 2018-07-24 DIAGNOSIS — R278 Other lack of coordination: Secondary | ICD-10-CM | POA: Diagnosis not present
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
## 2018-07-25 ENCOUNTER — Ambulatory Visit: Payer: Managed Care, Other (non HMO) | Admitting: Occupational Therapy

## 2018-07-25 ENCOUNTER — Encounter: Payer: Self-pay | Admitting: Occupational Therapy

## 2018-07-25 DIAGNOSIS — M6281 Muscle weakness (generalized): Secondary | ICD-10-CM

## 2018-07-25 DIAGNOSIS — R2689 Other abnormalities of gait and mobility: Secondary | ICD-10-CM | POA: Diagnosis not present

## 2018-07-25 DIAGNOSIS — R2681 Unsteadiness on feet: Secondary | ICD-10-CM | POA: Diagnosis not present

## 2018-07-25 DIAGNOSIS — R278 Other lack of coordination: Secondary | ICD-10-CM | POA: Diagnosis not present
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
## 2018-07-29 ENCOUNTER — Encounter: Payer: Self-pay | Admitting: Occupational Therapy

## 2018-07-29 ENCOUNTER — Ambulatory Visit: Payer: Managed Care, Other (non HMO) | Admitting: Occupational Therapy

## 2018-07-29 ENCOUNTER — Ambulatory Visit: Payer: Managed Care, Other (non HMO)

## 2018-07-29 DIAGNOSIS — R2681 Unsteadiness on feet: Secondary | ICD-10-CM | POA: Diagnosis not present

## 2018-07-29 DIAGNOSIS — M6281 Muscle weakness (generalized): Secondary | ICD-10-CM | POA: Diagnosis not present

## 2018-07-29 DIAGNOSIS — R278 Other lack of coordination: Secondary | ICD-10-CM

## 2018-07-29 DIAGNOSIS — R2689 Other abnormalities of gait and mobility: Secondary | ICD-10-CM | POA: Diagnosis not present
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
## 2018-07-30 ENCOUNTER — Encounter: Payer: Self-pay | Admitting: Occupational Therapy

## 2018-07-30 ENCOUNTER — Ambulatory Visit: Payer: Managed Care, Other (non HMO)

## 2018-07-30 ENCOUNTER — Ambulatory Visit: Payer: Managed Care, Other (non HMO) | Admitting: Occupational Therapy

## 2018-07-30 DIAGNOSIS — M6281 Muscle weakness (generalized): Secondary | ICD-10-CM

## 2018-07-30 DIAGNOSIS — R278 Other lack of coordination: Secondary | ICD-10-CM

## 2018-07-30 DIAGNOSIS — R2689 Other abnormalities of gait and mobility: Secondary | ICD-10-CM

## 2018-07-30 DIAGNOSIS — R2681 Unsteadiness on feet: Secondary | ICD-10-CM
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
## 2018-07-31 DIAGNOSIS — Z01818 Encounter for other preprocedural examination: Secondary | ICD-10-CM | POA: Diagnosis not present

## 2018-07-31 DIAGNOSIS — S73101D Unspecified sprain of right hip, subsequent encounter: Secondary | ICD-10-CM | POA: Diagnosis not present

## 2018-08-01 ENCOUNTER — Ambulatory Visit: Payer: Managed Care, Other (non HMO)

## 2018-08-01 DIAGNOSIS — M6281 Muscle weakness (generalized): Secondary | ICD-10-CM

## 2018-08-01 DIAGNOSIS — R2689 Other abnormalities of gait and mobility: Secondary | ICD-10-CM | POA: Diagnosis not present

## 2018-08-01 DIAGNOSIS — R278 Other lack of coordination: Secondary | ICD-10-CM

## 2018-08-01 DIAGNOSIS — R2681 Unsteadiness on feet: Secondary | ICD-10-CM
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
## 2018-08-05 ENCOUNTER — Encounter: Payer: Self-pay | Admitting: Occupational Therapy

## 2018-08-05 ENCOUNTER — Ambulatory Visit: Payer: Managed Care, Other (non HMO) | Admitting: Occupational Therapy

## 2018-08-05 ENCOUNTER — Ambulatory Visit: Payer: Managed Care, Other (non HMO)

## 2018-08-05 DIAGNOSIS — R2681 Unsteadiness on feet: Secondary | ICD-10-CM

## 2018-08-05 DIAGNOSIS — M6281 Muscle weakness (generalized): Secondary | ICD-10-CM | POA: Diagnosis not present

## 2018-08-05 DIAGNOSIS — R278 Other lack of coordination: Secondary | ICD-10-CM

## 2018-08-05 DIAGNOSIS — R2689 Other abnormalities of gait and mobility: Secondary | ICD-10-CM
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
## 2018-08-06 ENCOUNTER — Encounter: Payer: Self-pay | Admitting: Occupational Therapy

## 2018-08-06 ENCOUNTER — Ambulatory Visit: Payer: Managed Care, Other (non HMO) | Admitting: Occupational Therapy

## 2018-08-06 DIAGNOSIS — M6281 Muscle weakness (generalized): Secondary | ICD-10-CM | POA: Diagnosis not present

## 2018-08-06 DIAGNOSIS — R2681 Unsteadiness on feet: Secondary | ICD-10-CM | POA: Diagnosis not present

## 2018-08-06 DIAGNOSIS — R2689 Other abnormalities of gait and mobility: Secondary | ICD-10-CM | POA: Diagnosis not present

## 2018-08-06 DIAGNOSIS — R278 Other lack of coordination: Secondary | ICD-10-CM | POA: Diagnosis not present
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
## 2018-08-07 DIAGNOSIS — S42401D Unspecified fracture of lower end of right humerus, subsequent encounter for fracture with routine healing: Secondary | ICD-10-CM | POA: Diagnosis not present

## 2018-08-08 ENCOUNTER — Ambulatory Visit: Payer: Managed Care, Other (non HMO) | Admitting: Occupational Therapy

## 2018-08-08 ENCOUNTER — Ambulatory Visit: Payer: Managed Care, Other (non HMO) | Admitting: Physical Therapy

## 2018-08-08 ENCOUNTER — Encounter: Payer: Self-pay | Admitting: Physical Therapy

## 2018-08-08 ENCOUNTER — Encounter: Payer: Self-pay | Admitting: Occupational Therapy

## 2018-08-08 DIAGNOSIS — R278 Other lack of coordination: Secondary | ICD-10-CM

## 2018-08-08 DIAGNOSIS — M6281 Muscle weakness (generalized): Secondary | ICD-10-CM

## 2018-08-08 DIAGNOSIS — R2681 Unsteadiness on feet: Secondary | ICD-10-CM

## 2018-08-08 DIAGNOSIS — R2689 Other abnormalities of gait and mobility: Secondary | ICD-10-CM | POA: Diagnosis not present
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
## 2018-08-09 ENCOUNTER — Ambulatory Visit: Payer: Managed Care, Other (non HMO)

## 2018-08-09 ENCOUNTER — Encounter: Payer: Managed Care, Other (non HMO) | Admitting: Occupational Therapy

## 2018-08-12 DIAGNOSIS — M25851 Other specified joint disorders, right hip: Secondary | ICD-10-CM | POA: Diagnosis not present

## 2018-08-12 DIAGNOSIS — M24151 Other articular cartilage disorders, right hip: Secondary | ICD-10-CM | POA: Diagnosis not present

## 2018-08-12 DIAGNOSIS — S73101A Unspecified sprain of right hip, initial encounter: Secondary | ICD-10-CM | POA: Diagnosis not present

## 2018-08-12 DIAGNOSIS — S73101D Unspecified sprain of right hip, subsequent encounter: Secondary | ICD-10-CM | POA: Diagnosis not present

## 2018-08-12 DIAGNOSIS — S73191A Other sprain of right hip, initial encounter: Secondary | ICD-10-CM | POA: Diagnosis not present

## 2018-08-13 ENCOUNTER — Encounter: Payer: Managed Care, Other (non HMO) | Admitting: Occupational Therapy

## 2018-08-13 ENCOUNTER — Ambulatory Visit: Payer: Managed Care, Other (non HMO)

## 2018-08-14 ENCOUNTER — Encounter: Payer: Managed Care, Other (non HMO) | Admitting: Occupational Therapy

## 2018-08-15 ENCOUNTER — Ambulatory Visit: Payer: Managed Care, Other (non HMO) | Admitting: Physical Therapy

## 2018-08-15 ENCOUNTER — Encounter: Payer: Managed Care, Other (non HMO) | Admitting: Occupational Therapy

## 2018-08-15 ENCOUNTER — Ambulatory Visit: Payer: Managed Care, Other (non HMO)

## 2018-08-16 ENCOUNTER — Encounter: Payer: Managed Care, Other (non HMO) | Admitting: Occupational Therapy

## 2018-08-19 ENCOUNTER — Encounter: Payer: Managed Care, Other (non HMO) | Admitting: Occupational Therapy

## 2018-08-19 ENCOUNTER — Ambulatory Visit: Payer: BC Managed Care – PPO | Admitting: Occupational Therapy

## 2018-08-20 ENCOUNTER — Encounter: Payer: Managed Care, Other (non HMO) | Admitting: Occupational Therapy

## 2018-08-20 ENCOUNTER — Ambulatory Visit: Payer: Managed Care, Other (non HMO)

## 2018-08-20 ENCOUNTER — Ambulatory Visit: Payer: BC Managed Care – PPO

## 2018-08-22 ENCOUNTER — Ambulatory Visit: Payer: Managed Care, Other (non HMO)

## 2018-08-22 ENCOUNTER — Encounter: Payer: Managed Care, Other (non HMO) | Admitting: Occupational Therapy

## 2018-08-26 ENCOUNTER — Encounter: Payer: Managed Care, Other (non HMO) | Admitting: Occupational Therapy

## 2018-08-26 ENCOUNTER — Encounter: Payer: Managed Care, Other (non HMO) | Attending: Psychology | Admitting: Psychology

## 2018-08-26 DIAGNOSIS — S069X4S Unspecified intracranial injury with loss of consciousness of 6 hours to 24 hours, sequela: Secondary | ICD-10-CM | POA: Diagnosis not present

## 2018-08-26 DIAGNOSIS — S73101D Unspecified sprain of right hip, subsequent encounter: Secondary | ICD-10-CM | POA: Diagnosis not present

## 2018-08-26 DIAGNOSIS — M24151 Other articular cartilage disorders, right hip: Secondary | ICD-10-CM | POA: Diagnosis not present

## 2018-08-26 DIAGNOSIS — S069X4D Unspecified intracranial injury with loss of consciousness of 6 hours to 24 hours, subsequent encounter: Secondary | ICD-10-CM

## 2018-08-26 DIAGNOSIS — R4189 Other symptoms and signs involving cognitive functions and awareness: Secondary | ICD-10-CM | POA: Insufficient documentation

## 2018-08-27 ENCOUNTER — Encounter: Payer: Managed Care, Other (non HMO) | Admitting: Occupational Therapy

## 2018-08-27 ENCOUNTER — Ambulatory Visit: Payer: BC Managed Care – PPO | Attending: Orthopedic Surgery

## 2018-08-27 ENCOUNTER — Ambulatory Visit: Payer: BC Managed Care – PPO | Admitting: Occupational Therapy

## 2018-08-27 ENCOUNTER — Ambulatory Visit: Payer: Managed Care, Other (non HMO)

## 2018-08-27 DIAGNOSIS — R2681 Unsteadiness on feet: Secondary | ICD-10-CM | POA: Diagnosis not present

## 2018-08-27 DIAGNOSIS — R278 Other lack of coordination: Secondary | ICD-10-CM

## 2018-08-27 DIAGNOSIS — R41841 Cognitive communication deficit: Secondary | ICD-10-CM | POA: Insufficient documentation

## 2018-08-27 DIAGNOSIS — M6281 Muscle weakness (generalized): Secondary | ICD-10-CM | POA: Insufficient documentation

## 2018-08-27 DIAGNOSIS — M25551 Pain in right hip: Secondary | ICD-10-CM | POA: Diagnosis not present

## 2018-08-27 DIAGNOSIS — R2689 Other abnormalities of gait and mobility: Secondary | ICD-10-CM | POA: Diagnosis not present

## 2018-08-27 NOTE — Patient Instructions (Signed)
Access Code: [email or web address]  URL: [email or web address]  Date: 08/27/2018  Prepared by: Roxana Hires   Exercises  Supine Hip Flexion - 10 reps - 2 sets - 3-5 seconds hold - 2x daily - 7x weekly  Prone Quadriceps Stretch with Strap - 3 reps - 30 seconds hold - 2x daily - 7x weekly  Supine Bridge - 10 reps - 2 sets - 3-5 seconds hold - 2x daily - 7x weekly
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
## 2018-08-28 ENCOUNTER — Encounter: Payer: Self-pay | Admitting: Psychology

## 2018-08-28 ENCOUNTER — Encounter: Payer: Managed Care, Other (non HMO) | Admitting: Occupational Therapy
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
## 2018-08-29 ENCOUNTER — Encounter: Payer: Managed Care, Other (non HMO) | Admitting: Occupational Therapy

## 2018-08-29 ENCOUNTER — Ambulatory Visit: Payer: Managed Care, Other (non HMO)

## 2018-08-29 ENCOUNTER — Ambulatory Visit: Payer: BC Managed Care – PPO | Admitting: Occupational Therapy

## 2018-08-29 ENCOUNTER — Encounter: Payer: Self-pay | Admitting: Occupational Therapy

## 2018-08-29 ENCOUNTER — Ambulatory Visit: Payer: BC Managed Care – PPO

## 2018-08-29 DIAGNOSIS — M25551 Pain in right hip: Secondary | ICD-10-CM | POA: Diagnosis not present

## 2018-08-29 DIAGNOSIS — M6281 Muscle weakness (generalized): Secondary | ICD-10-CM

## 2018-08-29 DIAGNOSIS — R278 Other lack of coordination: Secondary | ICD-10-CM | POA: Diagnosis not present

## 2018-08-29 DIAGNOSIS — R2689 Other abnormalities of gait and mobility: Secondary | ICD-10-CM | POA: Diagnosis not present

## 2018-08-29 DIAGNOSIS — R2681 Unsteadiness on feet: Secondary | ICD-10-CM | POA: Diagnosis not present

## 2018-08-29 DIAGNOSIS — R41841 Cognitive communication deficit: Secondary | ICD-10-CM | POA: Diagnosis not present
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
## 2018-09-02 ENCOUNTER — Ambulatory Visit: Payer: BC Managed Care – PPO | Admitting: Occupational Therapy

## 2018-09-02 ENCOUNTER — Encounter: Payer: Self-pay | Admitting: Occupational Therapy

## 2018-09-02 ENCOUNTER — Encounter: Payer: Managed Care, Other (non HMO) | Admitting: Occupational Therapy

## 2018-09-02 DIAGNOSIS — R41841 Cognitive communication deficit: Secondary | ICD-10-CM | POA: Diagnosis not present

## 2018-09-02 DIAGNOSIS — R2681 Unsteadiness on feet: Secondary | ICD-10-CM | POA: Diagnosis not present

## 2018-09-02 DIAGNOSIS — R278 Other lack of coordination: Secondary | ICD-10-CM | POA: Diagnosis not present

## 2018-09-02 DIAGNOSIS — M6281 Muscle weakness (generalized): Secondary | ICD-10-CM | POA: Diagnosis not present

## 2018-09-02 DIAGNOSIS — R2689 Other abnormalities of gait and mobility: Secondary | ICD-10-CM | POA: Diagnosis not present

## 2018-09-02 DIAGNOSIS — M25551 Pain in right hip: Secondary | ICD-10-CM | POA: Diagnosis not present

## 2018-09-03 ENCOUNTER — Ambulatory Visit: Payer: BC Managed Care – PPO

## 2018-09-03 ENCOUNTER — Ambulatory Visit: Payer: BC Managed Care – PPO | Admitting: Occupational Therapy

## 2018-09-03 ENCOUNTER — Ambulatory Visit: Payer: Managed Care, Other (non HMO)

## 2018-09-03 ENCOUNTER — Encounter: Payer: Managed Care, Other (non HMO) | Admitting: Occupational Therapy

## 2018-09-03 DIAGNOSIS — R2681 Unsteadiness on feet: Secondary | ICD-10-CM

## 2018-09-03 DIAGNOSIS — M6281 Muscle weakness (generalized): Secondary | ICD-10-CM | POA: Diagnosis not present

## 2018-09-03 DIAGNOSIS — R2689 Other abnormalities of gait and mobility: Secondary | ICD-10-CM

## 2018-09-03 DIAGNOSIS — R278 Other lack of coordination: Secondary | ICD-10-CM

## 2018-09-03 DIAGNOSIS — R41841 Cognitive communication deficit: Secondary | ICD-10-CM | POA: Diagnosis not present

## 2018-09-03 DIAGNOSIS — M25551 Pain in right hip: Secondary | ICD-10-CM
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
## 2018-09-05 ENCOUNTER — Encounter: Payer: Managed Care, Other (non HMO) | Admitting: Occupational Therapy

## 2018-09-05 ENCOUNTER — Ambulatory Visit: Payer: BC Managed Care – PPO

## 2018-09-05 ENCOUNTER — Encounter: Payer: Self-pay | Admitting: Occupational Therapy

## 2018-09-05 ENCOUNTER — Ambulatory Visit: Payer: BC Managed Care – PPO | Admitting: Occupational Therapy

## 2018-09-05 ENCOUNTER — Ambulatory Visit: Payer: Managed Care, Other (non HMO)

## 2018-09-05 DIAGNOSIS — M25551 Pain in right hip: Secondary | ICD-10-CM

## 2018-09-05 DIAGNOSIS — R41841 Cognitive communication deficit: Secondary | ICD-10-CM | POA: Diagnosis not present

## 2018-09-05 DIAGNOSIS — R2681 Unsteadiness on feet: Secondary | ICD-10-CM | POA: Diagnosis not present

## 2018-09-05 DIAGNOSIS — R2689 Other abnormalities of gait and mobility: Secondary | ICD-10-CM

## 2018-09-05 DIAGNOSIS — R278 Other lack of coordination: Secondary | ICD-10-CM | POA: Diagnosis not present

## 2018-09-05 DIAGNOSIS — M6281 Muscle weakness (generalized): Secondary | ICD-10-CM
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
## 2018-09-09 ENCOUNTER — Encounter: Payer: Managed Care, Other (non HMO) | Admitting: Occupational Therapy

## 2018-09-10 ENCOUNTER — Ambulatory Visit: Payer: Managed Care, Other (non HMO)

## 2018-09-10 ENCOUNTER — Ambulatory Visit: Payer: BC Managed Care – PPO

## 2018-09-10 ENCOUNTER — Ambulatory Visit: Payer: BC Managed Care – PPO | Admitting: Occupational Therapy

## 2018-09-10 ENCOUNTER — Encounter: Payer: Self-pay | Admitting: Occupational Therapy

## 2018-09-10 ENCOUNTER — Encounter: Payer: Managed Care, Other (non HMO) | Admitting: Occupational Therapy

## 2018-09-10 DIAGNOSIS — M6281 Muscle weakness (generalized): Secondary | ICD-10-CM

## 2018-09-10 DIAGNOSIS — R2689 Other abnormalities of gait and mobility: Secondary | ICD-10-CM | POA: Diagnosis not present

## 2018-09-10 DIAGNOSIS — R278 Other lack of coordination: Secondary | ICD-10-CM | POA: Diagnosis not present

## 2018-09-10 DIAGNOSIS — R41841 Cognitive communication deficit: Secondary | ICD-10-CM | POA: Diagnosis not present

## 2018-09-10 DIAGNOSIS — R2681 Unsteadiness on feet: Secondary | ICD-10-CM | POA: Diagnosis not present

## 2018-09-10 DIAGNOSIS — M25551 Pain in right hip: Secondary | ICD-10-CM
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
## 2018-09-11 ENCOUNTER — Encounter: Payer: Managed Care, Other (non HMO) | Admitting: Occupational Therapy

## 2018-09-12 ENCOUNTER — Ambulatory Visit: Payer: Managed Care, Other (non HMO)

## 2018-09-12 ENCOUNTER — Ambulatory Visit: Payer: BC Managed Care – PPO

## 2018-09-12 ENCOUNTER — Encounter: Payer: Self-pay | Admitting: Occupational Therapy

## 2018-09-12 ENCOUNTER — Encounter: Payer: Managed Care, Other (non HMO) | Admitting: Occupational Therapy

## 2018-09-12 ENCOUNTER — Ambulatory Visit: Payer: BC Managed Care – PPO | Admitting: Occupational Therapy

## 2018-09-12 DIAGNOSIS — R278 Other lack of coordination: Secondary | ICD-10-CM

## 2018-09-12 DIAGNOSIS — M6281 Muscle weakness (generalized): Secondary | ICD-10-CM

## 2018-09-12 DIAGNOSIS — R2681 Unsteadiness on feet: Secondary | ICD-10-CM | POA: Diagnosis not present

## 2018-09-12 DIAGNOSIS — M25551 Pain in right hip: Secondary | ICD-10-CM

## 2018-09-12 DIAGNOSIS — R2689 Other abnormalities of gait and mobility: Secondary | ICD-10-CM | POA: Diagnosis not present

## 2018-09-12 DIAGNOSIS — R41841 Cognitive communication deficit: Secondary | ICD-10-CM | POA: Diagnosis not present
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
## 2018-09-12 NOTE — Patient Instructions (Signed)
Access Code: [email or web address]  URL: [email or web address]  Date: 09/12/2018  Prepared by: Roxana Hires   Exercises  Clamshell with Resistance - 10 reps - 2 sets - 3 seconds hold - 1x daily - 7x weekly  ITB Stretch at Wall - 3 reps - 30-45 seconds hold - 1x daily - 7x weekly

## 2018-09-16 DIAGNOSIS — S066X6D Traumatic subarachnoid hemorrhage with loss of consciousness greater than 24 hours without return to pre-existing conscious level with patient surviving, subsequent encounter: Secondary | ICD-10-CM | POA: Diagnosis not present

## 2018-09-17 ENCOUNTER — Encounter: Payer: Managed Care, Other (non HMO) | Admitting: Occupational Therapy

## 2018-09-18 ENCOUNTER — Encounter: Payer: Self-pay | Admitting: Occupational Therapy

## 2018-09-18 ENCOUNTER — Ambulatory Visit: Payer: BC Managed Care – PPO | Attending: Orthopedic Surgery | Admitting: Occupational Therapy

## 2018-09-18 DIAGNOSIS — M6281 Muscle weakness (generalized): Secondary | ICD-10-CM | POA: Insufficient documentation

## 2018-09-18 DIAGNOSIS — M25551 Pain in right hip: Secondary | ICD-10-CM | POA: Insufficient documentation

## 2018-09-18 DIAGNOSIS — R2689 Other abnormalities of gait and mobility: Secondary | ICD-10-CM | POA: Diagnosis not present

## 2018-09-18 DIAGNOSIS — R278 Other lack of coordination: Secondary | ICD-10-CM | POA: Diagnosis not present
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
## 2018-09-19 ENCOUNTER — Ambulatory Visit: Payer: BC Managed Care – PPO | Admitting: Occupational Therapy

## 2018-09-19 ENCOUNTER — Ambulatory Visit: Payer: BC Managed Care – PPO

## 2018-09-23 ENCOUNTER — Encounter: Payer: Self-pay | Admitting: Occupational Therapy

## 2018-09-23 ENCOUNTER — Ambulatory Visit: Payer: BC Managed Care – PPO | Admitting: Occupational Therapy

## 2018-09-23 DIAGNOSIS — R278 Other lack of coordination: Secondary | ICD-10-CM | POA: Diagnosis not present

## 2018-09-23 DIAGNOSIS — M25551 Pain in right hip: Secondary | ICD-10-CM | POA: Diagnosis not present

## 2018-09-23 DIAGNOSIS — M6281 Muscle weakness (generalized): Secondary | ICD-10-CM

## 2018-09-23 DIAGNOSIS — R2689 Other abnormalities of gait and mobility: Secondary | ICD-10-CM | POA: Diagnosis not present

## 2018-09-23 NOTE — Therapy (Signed)
[place] MAIN [facility] SERVICES [address] [place], [place], [address] Phone: [phone]   Fax:  [phone]  Occupational Therapy Treatment  Patient Details  Name: Monica Forbes MRN: [id number] Date of Birth: 1996/05/08 No data recorded  Encounter Date: 09/23/2018  OT End of Session - 09/23/18 1654    Visit Number  48    Number of Visits  72    Date for OT Re-Evaluation  10/28/18    OT Start Time  1648    OT Stop Time  1730    OT Time Calculation (min)  42 min    Activity Tolerance  Patient tolerated treatment well    Behavior During Therapy  [facility] for tasks assessed/performed       History reviewed. No pertinent past medical history.  History reviewed. No pertinent surgical history.  There were no vitals filed for this visit.  Subjective Assessment - 09/23/18 1653    Subjective   Pt. reports that her dog ids potty training easier.    Patient Stated Goals  Patient would like to work on left arm mobility to have more control over it and work on laundry tasks since reaching is difficult with LUE.     Currently in Pain?  No/denies      OT TREATMENT    Therapeutic Exercise:  Pt. Worked on the [employer] for 8 min. With constant monitoring of the BUEs. Pt. Worked on changing, and alternating forward reverse position every 2 min. Rest breaks were required. Pt. Worked on level 5.0 with emphasis placed on left elbow extension. Pt. performed5# dowel ex. For UE strengthening secondary to weakness. Bilateral shoulder flexion, chest press, circular patterns, and elbow flexion/extension were performed.4# dumbbell for elbow extension, forearm supination, 4# wrist flexion, and extension.Pt. performed gross gripping with grip strengthener. Pt. worked on sustaining grip while grasping pegs and reaching at various heights. The Gripper was placed in the4thresistive slot with the white resistive spring.Pt. worked on pressing a green resistive clips with  her with 2nd digit isolated, and her left shoulder abducted, and elbow extended. The resistive clips was placed in a vertical position to simulate pressing ATM keys from a seated position in a car. Pt. Worked on pinch strengthening in the left hand for lateral, and 3pt. pinch using yellow, red, green, and blue resistive clips. Pt. worked on placing the clips at various vertical and horizontal angles. Tactile and verbal cues were required for eliciting the desired movement.                        OT Education - 09/23/18 1653    Education Details  UE ther. ex.    Person(s) Educated  Patient    Methods  Explanation;Demonstration    Comprehension  Verbalized understanding;Returned demonstration          OT Long Term Goals - 08/27/18 1709      OT LONG TERM GOAL #1   Title  Pt will demonstrate HEP with modified independence for LUE.    Baseline  Re eval: requires assist for ROM and exercises.    Time  12    Period  Weeks    Status  On-going    Target Date  11/19/18      OT LONG TERM GOAL #2   Title  Patient will demonstrate UB and LB dressing independently without modifications.     Baseline  08/27/18: Pt. is indpendent with donning a shirt, and  bra. MinA donning pants since recent hip surgery.    Time  12    Period  Weeks    Status  On-going    Target Date  11/19/18      Long Term Additional Goals   Additional Long Term Goals  Yes      OT LONG TERM GOAL #6   Title  Patient will demonstrate ability to lift and place carry on bag into overhead bin for travel with modified independence.    Baseline  08/27/00: pt. conitnues to have difficulty.    Time  12    Period  Weeks    Status  On-going    Target Date  11/19/18      OT LONG TERM GOAL #7   Title  Patient will be able to reach to retrieve items out of low cabinets with good balance.      Baseline  Pt. has difficulty reaching low into cabinets, 08/05/18 still working on but slightly improved.     Time  12     Period  Weeks    Status  On-going    Target Date  11/19/18      OT LONG TERM GOAL #8   Title  Patient will demonstrate ability to reach out with left UE to manage ATM transaction with modified independence without difficulty     Baseline  08/27/2018: Pt. continues to have difficulty.    Time  12    Period  Weeks    Status  On-going    Target Date  11/19/18      OT LONG TERM GOAL  #9   Baseline  Patient will complete am self care tasks in one hour or less with modified independence     Time  12    Period  Weeks    Status  Achieved      OT LONG TERM GOAL  #10   TITLE  Patient will complete small buttons on a shirt with modified independence with good speed and dexterity.    Baseline  difficulty with small buttons    Time  12    Period  Weeks    Status  On-going    Target Date  11/19/18      OT LONG TERM GOAL  #11   TITLE  Patient will attend to focused task for 45 mins without rest break to complete work task.      Baseline  08/27/2018: Pt. continues to present with decreased attention to details around 30 mins for tasks.    Time  12    Period  Weeks    Status  On-going    Target Date  11/19/18      OT LONG TERM GOAL  #12   TITLE  Pt. will independently be able to apply a fitted sheet with modified independence.    Baseline  08/27/2018: Pt. is unable to perform without significant difficulty    Time  12    Period  Weeks    Status  New    Target Date  11/19/18            Plan - 09/23/18 1655    Clinical Impression Statement Pt. will be talking to the [facility] about going for intense [employer] with a Chiropractor using functional neurology.  Pt. continues to work on improving left elbow extension, left UE strength, and [facility] skills in order to improve her ability to use an ATM machine, as well as ADLs, and  IADLs.    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  Current apartment is on 3rd floor and will have to move to 1st floor apt.     Occupational performance deficits (Please refer to evaluation for details):  ADL's;IADL's;Work;Social Participation;Leisure    Rehab Potential  Good    Clinical Decision Making  Several treatment options, min-mod task modification necessary    OT Frequency  3x / week    OT Duration  12 weeks    OT Treatment/Interventions  Self-care/ADL training;Therapeutic exercise;Moist Heat;Neuromuscular education;Patient/family education;Splinting;Functional Mobility Training;Scar mobilization;Therapeutic activities;Balance training;Contrast Bath;DME and/or AE instruction;Passive range of motion;Manual Therapy;Cognitive remediation/compensation    Consulted and Agree with Plan of Care  Patient    Family Member Consulted  Husband       Patient will benefit from skilled therapeutic intervention in order to improve the following deficits and impairments:     Visit Diagnosis: Muscle weakness (generalized)  Other lack of coordination    Problem List There are no active problems to display for this patient.   Harrel Carina, MS, OTR/L 09/23/2018, 5:32 PM  [place] MAIN [facility] SERVICES [address] [place], [place], [address] Phone: [phone]   Fax:  [phone]  Name: AVRY MONTELEONE MRN: [id number] Date of Birth: April 27, 1996

## 2018-09-24 ENCOUNTER — Ambulatory Visit: Payer: BC Managed Care – PPO | Admitting: Occupational Therapy

## 2018-09-24 ENCOUNTER — Ambulatory Visit: Payer: BC Managed Care – PPO

## 2018-09-24 DIAGNOSIS — R2689 Other abnormalities of gait and mobility: Secondary | ICD-10-CM

## 2018-09-24 DIAGNOSIS — R278 Other lack of coordination: Secondary | ICD-10-CM

## 2018-09-24 DIAGNOSIS — M6281 Muscle weakness (generalized): Secondary | ICD-10-CM | POA: Diagnosis not present

## 2018-09-24 DIAGNOSIS — M25551 Pain in right hip: Secondary | ICD-10-CM

## 2018-09-24 NOTE — Patient Instructions (Signed)
Access Code: [email or web address] URL: [email or web address] Date: 09/24/2018 Prepared by: Janna Arch  Exercises Forward Step Up - 10 reps - 2 sets - 5 hold -1x daily - 7x weekly
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
## 2018-09-26 ENCOUNTER — Ambulatory Visit: Payer: BC Managed Care – PPO

## 2018-09-26 ENCOUNTER — Encounter: Payer: Self-pay | Admitting: Occupational Therapy

## 2018-09-26 ENCOUNTER — Other Ambulatory Visit: Payer: Self-pay

## 2018-09-26 ENCOUNTER — Ambulatory Visit: Payer: BC Managed Care – PPO | Admitting: Occupational Therapy

## 2018-09-26 DIAGNOSIS — R278 Other lack of coordination: Secondary | ICD-10-CM | POA: Diagnosis not present

## 2018-09-26 DIAGNOSIS — R2689 Other abnormalities of gait and mobility: Secondary | ICD-10-CM | POA: Diagnosis not present

## 2018-09-26 DIAGNOSIS — M6281 Muscle weakness (generalized): Secondary | ICD-10-CM

## 2018-09-26 DIAGNOSIS — M25551 Pain in right hip: Secondary | ICD-10-CM
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
## 2018-09-27 DIAGNOSIS — S73101D Unspecified sprain of right hip, subsequent encounter: Secondary | ICD-10-CM | POA: Diagnosis not present

## 2018-09-27 DIAGNOSIS — M24151 Other articular cartilage disorders, right hip: Secondary | ICD-10-CM | POA: Diagnosis not present

## 2018-09-30 ENCOUNTER — Ambulatory Visit: Payer: BC Managed Care – PPO | Admitting: Occupational Therapy

## 2018-09-30 ENCOUNTER — Other Ambulatory Visit: Payer: Self-pay

## 2018-09-30 ENCOUNTER — Encounter: Payer: Self-pay | Admitting: Occupational Therapy

## 2018-09-30 DIAGNOSIS — R278 Other lack of coordination: Secondary | ICD-10-CM | POA: Diagnosis not present

## 2018-09-30 DIAGNOSIS — M6281 Muscle weakness (generalized): Secondary | ICD-10-CM | POA: Diagnosis not present

## 2018-09-30 DIAGNOSIS — M25551 Pain in right hip: Secondary | ICD-10-CM | POA: Diagnosis not present

## 2018-09-30 DIAGNOSIS — R2689 Other abnormalities of gait and mobility: Secondary | ICD-10-CM | POA: Diagnosis not present
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
## 2018-10-01 ENCOUNTER — Ambulatory Visit: Payer: BC Managed Care – PPO | Admitting: Occupational Therapy

## 2018-10-01 ENCOUNTER — Encounter: Payer: Self-pay | Admitting: Occupational Therapy

## 2018-10-01 ENCOUNTER — Ambulatory Visit: Payer: BC Managed Care – PPO

## 2018-10-01 DIAGNOSIS — R278 Other lack of coordination: Secondary | ICD-10-CM | POA: Diagnosis not present

## 2018-10-01 DIAGNOSIS — M6281 Muscle weakness (generalized): Secondary | ICD-10-CM

## 2018-10-01 DIAGNOSIS — M25551 Pain in right hip: Secondary | ICD-10-CM

## 2018-10-01 DIAGNOSIS — R2689 Other abnormalities of gait and mobility: Secondary | ICD-10-CM | POA: Diagnosis not present
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
## 2018-10-03 ENCOUNTER — Ambulatory Visit: Payer: BC Managed Care – PPO | Admitting: Occupational Therapy

## 2018-10-03 ENCOUNTER — Ambulatory Visit: Payer: BC Managed Care – PPO

## 2018-10-07 ENCOUNTER — Encounter: Payer: Self-pay | Admitting: Occupational Therapy

## 2018-10-07 ENCOUNTER — Ambulatory Visit: Payer: BC Managed Care – PPO | Admitting: Occupational Therapy

## 2018-10-07 NOTE — Therapy (Signed)
[place] MAIN [facility] SERVICES [address] [place], [place], [address] Phone: [phone]   Fax:  [phone]  Patient Details  Name: Monica Forbes MRN: [id number] Date of Birth: 08-22-1995 Referring Provider:  No ref. provider found  Encounter Date: 10/07/2018  Attempted to reach to the pt. via telephone during the pandemic with no response.  Harrel Carina, MS, OTR/L 10/07/2018, 1:52 PM  [place] MAIN [facility] SERVICES [address] [place], [place], [address] Phone: [phone]   Fax:  [phone]

## 2018-10-08 ENCOUNTER — Ambulatory Visit: Payer: BC Managed Care – PPO | Admitting: Occupational Therapy

## 2018-10-08 ENCOUNTER — Ambulatory Visit: Payer: BC Managed Care – PPO

## 2018-10-08 NOTE — Therapy (Signed)
[place] MAIN [facility] SERVICES [address] [place], [place], [address] Phone: [phone]   Fax:  [phone]  Patient Details  Name: Monica Forbes MRN: [id number] Date of Birth: 1996/02/25 Referring Provider:  No ref. provider found  Encounter Date: 10/08/2018  PT called and left message for pt to notify of outpatient physical therapy clinic closure secondary to COVID-19 precautions. Therapist requested patient return call if needed with any questions or concerns regarding his home physical therapy program while clinic is closed. Therapist provided pt with clinic phone number on voicemail as well as this therapist's email address in order to continue to prescribe home exercise program based on post-op protocol.   Phillips Grout PT, DPT, GCS  Huprich,Jason 10/08/2018, 12:21 PM  [place] MAIN [facility] SERVICES [address] [place], [place], [address] Phone: [phone]   Fax:  [phone]

## 2018-10-08 NOTE — Therapy (Signed)
[place] MAIN [facility] SERVICES [address] [place], [place], [address] Phone: [phone]   Fax:  [phone]  Patient Details  Name: QUANTAVIA FRITH MRN: [id number] Date of Birth: 03-20-1996 Referring Provider:  No ref. provider found  Encounter Date: 10/08/2018   Pt emailed therapist requesting exercise progression. Therapist created updated HEP to replace her current program based on her post-op protocol. Returned email to notify pt of updated program.   Access Code: [email or web address]  URL: [email or web address]  Date: 10/08/2018  Prepared by: Roxana Hires   Exercises  Standing Hip Extension with Resistance at Ankles and Counter Support - 10 reps - 3 sets - 3-5 seconds hold - 1x daily - 3x weekly  Step Up - 10 reps - 3 sets - 1x daily - 3x weekly  Mini Squat with Counter Support - 10 reps - 3 sets - 1x daily - 3x weekly  Marching Bridge - 10 reps - 3 sets - 3-5 seconds hold - 1x daily - 3x weekly  Standing Hip Abduction with Resistance at Ankles and Counter Support - 10 reps - 3 sets - 3-5 seconds hold - 1x daily - 3x weekly  Side Stepping with Resistance at Ankles - 3 reps - 30 seconds total from side to side hold - 1x daily - 3x weekly  Clamshell with Resistance - 10 reps - 3 sets - 3-5 seconds hold - 1x daily - 3x weekly  Side Plank on Knees - 3-5 reps - 30 seconds hold - 1x daily - 3x weekly  ITB Stretch at Wall - 3 reps - 30 seconds hold - 1x daily - 7x weekly  Hip Flexor Stretch at Edge of Bed - 3 reps - 30 seconds hold - 1x daily - 7x weekly    Phillips Grout PT, DPT, GCS  Huprich,Jason 10/08/2018, 4:09 PM  [place] [facility] MAIN [facility] SERVICES [address] [place], [place], [address] Phone: [phone]   Fax:  [phone]

## 2018-10-10 ENCOUNTER — Encounter: Payer: Managed Care, Other (non HMO) | Admitting: Occupational Therapy

## 2018-10-10 ENCOUNTER — Ambulatory Visit (INDEPENDENT_AMBULATORY_CARE_PROVIDER_SITE_OTHER): Payer: 59 | Admitting: Psychology

## 2018-10-10 DIAGNOSIS — F4310 Post-traumatic stress disorder, unspecified: Secondary | ICD-10-CM

## 2018-10-14 ENCOUNTER — Encounter: Payer: Managed Care, Other (non HMO) | Admitting: Occupational Therapy

## 2018-10-15 ENCOUNTER — Encounter: Payer: Managed Care, Other (non HMO) | Admitting: Occupational Therapy

## 2018-10-17 ENCOUNTER — Encounter: Payer: Managed Care, Other (non HMO) | Admitting: Occupational Therapy

## 2018-10-17 ENCOUNTER — Ambulatory Visit (INDEPENDENT_AMBULATORY_CARE_PROVIDER_SITE_OTHER): Payer: 59 | Admitting: Psychology

## 2018-10-17 DIAGNOSIS — F4310 Post-traumatic stress disorder, unspecified: Secondary | ICD-10-CM

## 2018-10-21 ENCOUNTER — Encounter: Payer: Self-pay | Admitting: Occupational Therapy

## 2018-10-22 ENCOUNTER — Encounter: Payer: Self-pay | Admitting: Occupational Therapy

## 2018-10-22 ENCOUNTER — Ambulatory Visit: Payer: Self-pay

## 2018-10-23 ENCOUNTER — Ambulatory Visit (INDEPENDENT_AMBULATORY_CARE_PROVIDER_SITE_OTHER): Payer: 59 | Admitting: Psychology

## 2018-10-23 DIAGNOSIS — F4310 Post-traumatic stress disorder, unspecified: Secondary | ICD-10-CM | POA: Diagnosis not present

## 2018-10-24 ENCOUNTER — Ambulatory Visit: Payer: Self-pay

## 2018-10-24 ENCOUNTER — Encounter: Payer: Self-pay | Admitting: Occupational Therapy

## 2018-10-25 ENCOUNTER — Encounter: Payer: Self-pay | Admitting: Occupational Therapy

## 2018-10-25 NOTE — Therapy (Signed)
[place] MAIN [facility] SERVICES [address] [place], [place], [address] Phone: [phone]   Fax:  [phone]  Patient Details  Name: Monica Forbes MRN: [id number] Date of Birth: 03-03-1996 Referring Provider:  No ref. provider found  Encounter Date: 10/25/2018  Reached out to the pt. via telephone to assist pt. in exploring options for continued therapy while the outpatient therapy clinics are closed due to COVID-19. The pt. expressed interest in [facility]. Pt. Was notified that a TeleHealth specialist would contact her next week with additional details.  Harrel Carina, MS, OTR/L 10/25/2018, 3:56 PM  [place] MAIN [facility] SERVICES [address] [place], [place], [address] Phone: [phone]   Fax:  [phone]

## 2018-10-28 ENCOUNTER — Encounter: Payer: Self-pay | Admitting: Occupational Therapy

## 2018-10-29 ENCOUNTER — Ambulatory Visit: Payer: Self-pay

## 2018-10-29 ENCOUNTER — Ambulatory Visit (INDEPENDENT_AMBULATORY_CARE_PROVIDER_SITE_OTHER): Payer: 59 | Admitting: Psychology

## 2018-10-29 ENCOUNTER — Encounter: Payer: Self-pay | Admitting: Occupational Therapy

## 2018-10-29 DIAGNOSIS — F4310 Post-traumatic stress disorder, unspecified: Secondary | ICD-10-CM | POA: Diagnosis not present

## 2018-10-31 ENCOUNTER — Encounter: Payer: Self-pay | Admitting: Occupational Therapy

## 2018-10-31 ENCOUNTER — Ambulatory Visit: Payer: Self-pay

## 2018-10-31 ENCOUNTER — Ambulatory Visit: Payer: Managed Care, Other (non HMO) | Attending: Orthopedic Surgery

## 2018-10-31 ENCOUNTER — Other Ambulatory Visit: Payer: Self-pay

## 2018-10-31 DIAGNOSIS — R278 Other lack of coordination: Secondary | ICD-10-CM | POA: Insufficient documentation

## 2018-10-31 DIAGNOSIS — M25551 Pain in right hip: Secondary | ICD-10-CM | POA: Diagnosis not present

## 2018-10-31 DIAGNOSIS — M6281 Muscle weakness (generalized): Secondary | ICD-10-CM | POA: Insufficient documentation
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
## 2018-11-04 ENCOUNTER — Encounter: Payer: Self-pay | Admitting: Occupational Therapy

## 2018-11-05 ENCOUNTER — Encounter: Payer: Self-pay | Admitting: Occupational Therapy

## 2018-11-05 ENCOUNTER — Ambulatory Visit: Payer: Self-pay

## 2018-11-06 ENCOUNTER — Ambulatory Visit (INDEPENDENT_AMBULATORY_CARE_PROVIDER_SITE_OTHER): Payer: 59 | Admitting: Psychology

## 2018-11-06 DIAGNOSIS — F4310 Post-traumatic stress disorder, unspecified: Secondary | ICD-10-CM

## 2018-11-07 ENCOUNTER — Ambulatory Visit: Payer: Managed Care, Other (non HMO) | Admitting: Occupational Therapy

## 2018-11-07 ENCOUNTER — Other Ambulatory Visit: Payer: Self-pay

## 2018-11-07 ENCOUNTER — Ambulatory Visit: Payer: Self-pay

## 2018-11-07 ENCOUNTER — Ambulatory Visit: Payer: Managed Care, Other (non HMO)

## 2018-11-07 ENCOUNTER — Encounter: Payer: Self-pay | Admitting: Occupational Therapy

## 2018-11-07 DIAGNOSIS — M25551 Pain in right hip: Secondary | ICD-10-CM | POA: Diagnosis not present

## 2018-11-07 DIAGNOSIS — R278 Other lack of coordination: Secondary | ICD-10-CM | POA: Diagnosis not present

## 2018-11-07 DIAGNOSIS — M6281 Muscle weakness (generalized): Secondary | ICD-10-CM | POA: Diagnosis not present
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
## 2018-11-09 ENCOUNTER — Encounter: Payer: Self-pay | Admitting: Occupational Therapy
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
## 2018-11-11 ENCOUNTER — Encounter: Payer: Self-pay | Admitting: Occupational Therapy

## 2018-11-11 ENCOUNTER — Other Ambulatory Visit: Payer: Self-pay

## 2018-11-11 ENCOUNTER — Ambulatory Visit: Payer: Managed Care, Other (non HMO)

## 2018-11-12 ENCOUNTER — Ambulatory Visit: Payer: BLUE CROSS/BLUE SHIELD

## 2018-11-12 ENCOUNTER — Ambulatory Visit: Payer: Self-pay

## 2018-11-12 ENCOUNTER — Ambulatory Visit: Payer: Managed Care, Other (non HMO) | Admitting: Occupational Therapy

## 2018-11-12 ENCOUNTER — Encounter: Payer: Self-pay | Admitting: Occupational Therapy

## 2018-11-13 ENCOUNTER — Ambulatory Visit (INDEPENDENT_AMBULATORY_CARE_PROVIDER_SITE_OTHER): Payer: 59 | Admitting: Psychology

## 2018-11-13 ENCOUNTER — Other Ambulatory Visit: Payer: Self-pay

## 2018-11-13 ENCOUNTER — Ambulatory Visit: Payer: BLUE CROSS/BLUE SHIELD

## 2018-11-13 ENCOUNTER — Ambulatory Visit: Payer: Managed Care, Other (non HMO) | Admitting: Occupational Therapy

## 2018-11-13 DIAGNOSIS — F4310 Post-traumatic stress disorder, unspecified: Secondary | ICD-10-CM

## 2018-11-13 DIAGNOSIS — R278 Other lack of coordination: Secondary | ICD-10-CM | POA: Diagnosis not present

## 2018-11-13 DIAGNOSIS — M6281 Muscle weakness (generalized): Secondary | ICD-10-CM | POA: Diagnosis not present

## 2018-11-13 DIAGNOSIS — M25551 Pain in right hip: Secondary | ICD-10-CM | POA: Diagnosis not present

## 2018-11-14 ENCOUNTER — Ambulatory Visit: Payer: Self-pay

## 2018-11-14 ENCOUNTER — Encounter: Payer: Self-pay | Admitting: Occupational Therapy
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
## 2018-11-15 ENCOUNTER — Encounter: Payer: Self-pay | Admitting: Psychology

## 2018-11-15 ENCOUNTER — Encounter: Payer: BC Managed Care – PPO | Attending: Psychology | Admitting: Psychology

## 2018-11-15 ENCOUNTER — Other Ambulatory Visit: Payer: Self-pay

## 2018-11-15 DIAGNOSIS — S069X4D Unspecified intracranial injury with loss of consciousness of 6 hours to 24 hours, subsequent encounter: Secondary | ICD-10-CM

## 2018-11-15 DIAGNOSIS — S069X4S Unspecified intracranial injury with loss of consciousness of 6 hours to 24 hours, sequela: Secondary | ICD-10-CM | POA: Insufficient documentation

## 2018-11-15 DIAGNOSIS — R4189 Other symptoms and signs involving cognitive functions and awareness: Secondary | ICD-10-CM | POA: Insufficient documentation

## 2018-11-19 ENCOUNTER — Ambulatory Visit: Payer: BC Managed Care – PPO

## 2018-11-19 ENCOUNTER — Encounter: Payer: Self-pay | Admitting: Psychology

## 2018-11-19 ENCOUNTER — Ambulatory Visit: Payer: BC Managed Care – PPO | Attending: Orthopedic Surgery | Admitting: Occupational Therapy

## 2018-11-19 ENCOUNTER — Other Ambulatory Visit: Payer: Self-pay

## 2018-11-19 DIAGNOSIS — R278 Other lack of coordination: Secondary | ICD-10-CM

## 2018-11-19 DIAGNOSIS — R41841 Cognitive communication deficit: Secondary | ICD-10-CM | POA: Insufficient documentation

## 2018-11-19 DIAGNOSIS — R2689 Other abnormalities of gait and mobility: Secondary | ICD-10-CM | POA: Diagnosis present

## 2018-11-19 DIAGNOSIS — M6281 Muscle weakness (generalized): Secondary | ICD-10-CM
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
## 2018-11-20 ENCOUNTER — Ambulatory Visit: Payer: BLUE CROSS/BLUE SHIELD

## 2018-11-20 ENCOUNTER — Ambulatory Visit (INDEPENDENT_AMBULATORY_CARE_PROVIDER_SITE_OTHER): Payer: 59 | Admitting: Psychology

## 2018-11-20 DIAGNOSIS — F4310 Post-traumatic stress disorder, unspecified: Secondary | ICD-10-CM

## 2018-11-22 ENCOUNTER — Ambulatory Visit: Payer: Managed Care, Other (non HMO) | Admitting: Psychology

## 2018-11-24 ENCOUNTER — Encounter: Payer: Self-pay | Admitting: Occupational Therapy

## 2018-11-27 ENCOUNTER — Ambulatory Visit (INDEPENDENT_AMBULATORY_CARE_PROVIDER_SITE_OTHER): Payer: 59 | Admitting: Psychology

## 2018-11-27 DIAGNOSIS — F4310 Post-traumatic stress disorder, unspecified: Secondary | ICD-10-CM
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
## 2018-11-28 ENCOUNTER — Other Ambulatory Visit: Payer: Self-pay

## 2018-11-28 ENCOUNTER — Ambulatory Visit: Payer: BC Managed Care – PPO | Admitting: Occupational Therapy

## 2018-11-28 ENCOUNTER — Ambulatory Visit: Payer: BC Managed Care – PPO

## 2018-11-28 DIAGNOSIS — R2689 Other abnormalities of gait and mobility: Secondary | ICD-10-CM

## 2018-11-28 DIAGNOSIS — M6281 Muscle weakness (generalized): Secondary | ICD-10-CM

## 2018-11-28 DIAGNOSIS — R278 Other lack of coordination: Secondary | ICD-10-CM
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
## 2018-11-29 ENCOUNTER — Encounter (HOSPITAL_BASED_OUTPATIENT_CLINIC_OR_DEPARTMENT_OTHER): Payer: BC Managed Care – PPO | Admitting: Psychology

## 2018-11-29 ENCOUNTER — Encounter: Payer: Self-pay | Admitting: Psychology

## 2018-11-29 ENCOUNTER — Other Ambulatory Visit: Payer: Self-pay

## 2018-11-29 DIAGNOSIS — F09 Unspecified mental disorder due to known physiological condition: Secondary | ICD-10-CM | POA: Diagnosis not present

## 2018-11-29 DIAGNOSIS — S069X4D Unspecified intracranial injury with loss of consciousness of 6 hours to 24 hours, subsequent encounter: Secondary | ICD-10-CM | POA: Diagnosis not present

## 2018-11-29 DIAGNOSIS — F0789 Other personality and behavioral disorders due to known physiological condition: Secondary | ICD-10-CM | POA: Diagnosis not present
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
## 2018-12-02 ENCOUNTER — Encounter: Payer: Self-pay | Admitting: Occupational Therapy
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
## 2018-12-03 ENCOUNTER — Ambulatory Visit: Payer: BC Managed Care – PPO | Admitting: Occupational Therapy

## 2018-12-03 ENCOUNTER — Encounter: Payer: Self-pay | Admitting: Occupational Therapy

## 2018-12-03 ENCOUNTER — Ambulatory Visit: Payer: BC Managed Care – PPO

## 2018-12-03 ENCOUNTER — Other Ambulatory Visit: Payer: Self-pay

## 2018-12-03 DIAGNOSIS — M6281 Muscle weakness (generalized): Secondary | ICD-10-CM

## 2018-12-03 DIAGNOSIS — R278 Other lack of coordination: Secondary | ICD-10-CM

## 2018-12-03 DIAGNOSIS — R2689 Other abnormalities of gait and mobility: Secondary | ICD-10-CM
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
## 2018-12-05 ENCOUNTER — Other Ambulatory Visit: Payer: Self-pay

## 2018-12-05 ENCOUNTER — Encounter: Payer: Self-pay | Admitting: Occupational Therapy

## 2018-12-05 ENCOUNTER — Ambulatory Visit: Payer: BC Managed Care – PPO

## 2018-12-05 ENCOUNTER — Ambulatory Visit: Payer: BC Managed Care – PPO | Admitting: Occupational Therapy

## 2018-12-05 DIAGNOSIS — R278 Other lack of coordination: Secondary | ICD-10-CM

## 2018-12-05 DIAGNOSIS — M6281 Muscle weakness (generalized): Secondary | ICD-10-CM | POA: Diagnosis not present

## 2018-12-05 DIAGNOSIS — R2689 Other abnormalities of gait and mobility: Secondary | ICD-10-CM
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
## 2018-12-06 ENCOUNTER — Ambulatory Visit (INDEPENDENT_AMBULATORY_CARE_PROVIDER_SITE_OTHER): Payer: 59 | Admitting: Psychology

## 2018-12-06 DIAGNOSIS — F4310 Post-traumatic stress disorder, unspecified: Secondary | ICD-10-CM

## 2018-12-10 ENCOUNTER — Other Ambulatory Visit: Payer: Self-pay

## 2018-12-10 ENCOUNTER — Ambulatory Visit: Payer: BC Managed Care – PPO

## 2018-12-10 ENCOUNTER — Ambulatory Visit: Payer: BC Managed Care – PPO | Admitting: Occupational Therapy

## 2018-12-10 DIAGNOSIS — R278 Other lack of coordination: Secondary | ICD-10-CM

## 2018-12-10 DIAGNOSIS — M6281 Muscle weakness (generalized): Secondary | ICD-10-CM | POA: Diagnosis not present

## 2018-12-10 DIAGNOSIS — R2689 Other abnormalities of gait and mobility: Secondary | ICD-10-CM
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
## 2018-12-11 ENCOUNTER — Ambulatory Visit (INDEPENDENT_AMBULATORY_CARE_PROVIDER_SITE_OTHER): Payer: 59 | Admitting: Psychology

## 2018-12-11 DIAGNOSIS — F4310 Post-traumatic stress disorder, unspecified: Secondary | ICD-10-CM | POA: Diagnosis not present

## 2018-12-12 ENCOUNTER — Other Ambulatory Visit: Payer: Self-pay

## 2018-12-12 ENCOUNTER — Ambulatory Visit: Payer: BC Managed Care – PPO

## 2018-12-12 ENCOUNTER — Ambulatory Visit: Payer: BC Managed Care – PPO | Admitting: Occupational Therapy

## 2018-12-12 DIAGNOSIS — R41841 Cognitive communication deficit: Secondary | ICD-10-CM

## 2018-12-12 DIAGNOSIS — M6281 Muscle weakness (generalized): Secondary | ICD-10-CM | POA: Diagnosis not present

## 2018-12-12 DIAGNOSIS — R2689 Other abnormalities of gait and mobility: Secondary | ICD-10-CM

## 2018-12-12 DIAGNOSIS — R278 Other lack of coordination: Secondary | ICD-10-CM
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
## 2018-12-13 ENCOUNTER — Other Ambulatory Visit: Payer: Self-pay

## 2018-12-13 ENCOUNTER — Encounter: Payer: Self-pay | Admitting: Psychology

## 2018-12-13 ENCOUNTER — Ambulatory Visit: Payer: 59 | Admitting: Psychology

## 2018-12-13 ENCOUNTER — Encounter (HOSPITAL_BASED_OUTPATIENT_CLINIC_OR_DEPARTMENT_OTHER): Payer: BC Managed Care – PPO | Admitting: Psychology

## 2018-12-13 DIAGNOSIS — F0789 Other personality and behavioral disorders due to known physiological condition: Secondary | ICD-10-CM

## 2018-12-13 DIAGNOSIS — S069X4D Unspecified intracranial injury with loss of consciousness of 6 hours to 24 hours, subsequent encounter: Secondary | ICD-10-CM | POA: Diagnosis not present

## 2018-12-13 DIAGNOSIS — S069X4S Unspecified intracranial injury with loss of consciousness of 6 hours to 24 hours, sequela: Secondary | ICD-10-CM | POA: Diagnosis not present

## 2018-12-13 DIAGNOSIS — F09 Unspecified mental disorder due to known physiological condition: Secondary | ICD-10-CM | POA: Diagnosis not present

## 2018-12-13 DIAGNOSIS — R4189 Other symptoms and signs involving cognitive functions and awareness: Secondary | ICD-10-CM | POA: Diagnosis not present
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
## 2018-12-15 ENCOUNTER — Encounter: Payer: Self-pay | Admitting: Occupational Therapy
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
## 2018-12-16 ENCOUNTER — Encounter: Payer: Managed Care, Other (non HMO) | Admitting: Occupational Therapy

## 2018-12-18 ENCOUNTER — Ambulatory Visit: Payer: Managed Care, Other (non HMO) | Attending: Orthopedic Surgery | Admitting: Occupational Therapy

## 2018-12-18 ENCOUNTER — Ambulatory Visit: Payer: Managed Care, Other (non HMO)

## 2018-12-18 ENCOUNTER — Encounter: Payer: Self-pay | Admitting: Occupational Therapy

## 2018-12-18 ENCOUNTER — Other Ambulatory Visit: Payer: Self-pay

## 2018-12-18 DIAGNOSIS — R2681 Unsteadiness on feet: Secondary | ICD-10-CM | POA: Insufficient documentation

## 2018-12-18 DIAGNOSIS — M6281 Muscle weakness (generalized): Secondary | ICD-10-CM

## 2018-12-18 DIAGNOSIS — R2689 Other abnormalities of gait and mobility: Secondary | ICD-10-CM | POA: Diagnosis present

## 2018-12-18 DIAGNOSIS — R278 Other lack of coordination: Secondary | ICD-10-CM | POA: Insufficient documentation
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
## 2018-12-20 ENCOUNTER — Encounter: Payer: BC Managed Care – PPO | Attending: Psychology | Admitting: Psychology

## 2018-12-20 ENCOUNTER — Encounter: Payer: Self-pay | Admitting: Psychology

## 2018-12-20 ENCOUNTER — Other Ambulatory Visit: Payer: Self-pay

## 2018-12-20 DIAGNOSIS — R4189 Other symptoms and signs involving cognitive functions and awareness: Secondary | ICD-10-CM | POA: Insufficient documentation

## 2018-12-20 DIAGNOSIS — S069X4S Unspecified intracranial injury with loss of consciousness of 6 hours to 24 hours, sequela: Secondary | ICD-10-CM | POA: Insufficient documentation

## 2018-12-20 DIAGNOSIS — S069X4D Unspecified intracranial injury with loss of consciousness of 6 hours to 24 hours, subsequent encounter: Secondary | ICD-10-CM

## 2018-12-20 NOTE — Progress Notes (Signed)
The patient arrived on time to her 13:00 testing appointment and was accompanied by her mother. The evaluation lasted 240 minutes.  Behavioral Observations:  Appearance: Casually and appropriately dressed/groomed.  Gait: Ambulated independently with some difficulty associated with right hip.  Speech: Clear, normal rate with normal tone and volume; Mild WFD noted Thought process: goal directed, mostly linear but tangential at times.  Affect: mildly anxious but mostly euthymic, appropriate   Interpersonal: polite and appropriate  Orientation: Oriented x 4 Effort/Motivation: Excellent   Patient did not appear to have much difficulty hearing or understanding test instructions but did require additional prompting and repeated questioning on occasion. Impulsivity and mildly disinhibited speech noted. She exhibited adequate frustration tolerance on questions she did not know or tasks that were more difficult. She persisted well overall appeared to get fatigued in the last 30 minutes (e.g. WMS-IV Symbol Span, DE II, & DE Recognition).   Tests Administered:  Comprehension Attention Battery  Wechsler Adult Intelligence Scale-Fourth Edition (WAIS-IV):   Wechsler Memory Scale-Fourth Edition (WMS-IV) Adult Version (ages 24-69)  Results of the WAIS-IV:    Composite Score Summary  Scale Sum of Scaled Scores Composite Score Percentile Rank 95% Conf. Interval Qualitative Description  Verbal Comprehension 38 VCI 114 82 108-119 High Average  Perceptual Reasoning 21 PRI 82 12 77-89 Low Average  Working Memory 13 WMI 80 9 74-88 Low Average  Processing Speed 17 PSI 92 30 84-101 Average  Full Scale 89 FSIQ 92 30 88-96 Average  General Ability 59 GAI 99 47 94-104 Average   Index Level Discrepancy Comparisons  Comparison Score 1 Score 2 Difference Critical Value .05 Significant Difference Y/N Base Rate by Overall Sample  VCI - PRI 114 82 32 9.29 Y 1.2  VCI - WMI 114 80 34 10.18 Y 0.6  VCI - PSI  114 92 22 10.99 Y 8.7  PRI - WMI 82 80 2 10.99 N 46.5  PRI - PSI 82 92 -10 11.75 N 26.1  WMI - PSI 80 92 -12 12.46 N 21.3  FSIQ - GAI 92 99 -7 3.50 Y 8.1   Differences Between Subtest and Overall Mean of Subtest Scores  Subtest Subtest Scaled Score Mean Scaled Score Difference Critical Value .05 Strength or Weakness Base Rate  Block Design 6 8.90 -2.90 2.85 W 15-25%  Similarities 14 8.90 5.10 2.82 S <1%  Digit Span 6 8.90 -2.90 2.22 W 15-25%  Vocabulary 14 8.90 5.10 2.03 S 1-2%   Results of the WMS-IV:   Index Score Summary  Index Sum of Scaled Scores Index Score Percentile Rank 95% Confidence Interval Qualitative Descriptor  Auditory Memory (AMI) 45 107 68 100-113 Average  Visual Memory (VMI) 26 80 9 75-86 Low Average  Visual Working Memory (VWMI) 13 80 9 74-89 Low Average  Immediate Memory (IMI) 34 89 23 83-96 Low Average  Delayed Memory (DMI) 37 94 34 88-101 Average   Primary Subtest Scaled Score Summary  Subtest Domain Raw Score Scaled Score Percentile Rank  Logical Memory I AM 34 13 84  Logical Memory II AM 33 14 91  Verbal Pa[employer] Associates I AM 30 8 25   Verbal Paired Associates II AM 12 10 50  Designs I VM 53 5 5  Designs II VM 45 6 9  Visual Reproduction I VM 35 8 25  Visual Reproduction II VM 19 7 16   Spatial Addition VWM 11 7 16   Symbol Span VWM 15 6 9

## 2018-12-23 ENCOUNTER — Ambulatory Visit: Payer: Managed Care, Other (non HMO) | Admitting: Occupational Therapy

## 2018-12-23 ENCOUNTER — Ambulatory Visit: Payer: Managed Care, Other (non HMO)

## 2018-12-23 DIAGNOSIS — S066X6D Traumatic subarachnoid hemorrhage with loss of consciousness greater than 24 hours without return to pre-existing conscious level with patient surviving, subsequent encounter: Secondary | ICD-10-CM | POA: Diagnosis not present

## 2018-12-25 ENCOUNTER — Ambulatory Visit: Payer: Managed Care, Other (non HMO)

## 2018-12-25 ENCOUNTER — Ambulatory Visit: Payer: Managed Care, Other (non HMO) | Admitting: Occupational Therapy

## 2018-12-25 ENCOUNTER — Ambulatory Visit: Payer: 59 | Admitting: Psychology

## 2018-12-27 ENCOUNTER — Other Ambulatory Visit: Payer: Self-pay

## 2018-12-27 ENCOUNTER — Encounter (HOSPITAL_BASED_OUTPATIENT_CLINIC_OR_DEPARTMENT_OTHER): Payer: BC Managed Care – PPO | Admitting: Psychology

## 2018-12-27 ENCOUNTER — Ambulatory Visit (INDEPENDENT_AMBULATORY_CARE_PROVIDER_SITE_OTHER): Payer: 59 | Admitting: Psychology

## 2018-12-27 DIAGNOSIS — R4189 Other symptoms and signs involving cognitive functions and awareness: Secondary | ICD-10-CM | POA: Diagnosis not present

## 2018-12-27 DIAGNOSIS — S069X4S Unspecified intracranial injury with loss of consciousness of 6 hours to 24 hours, sequela: Secondary | ICD-10-CM | POA: Diagnosis not present

## 2018-12-27 DIAGNOSIS — F09 Unspecified mental disorder due to known physiological condition: Secondary | ICD-10-CM | POA: Diagnosis not present

## 2018-12-27 DIAGNOSIS — F4310 Post-traumatic stress disorder, unspecified: Secondary | ICD-10-CM | POA: Diagnosis not present

## 2018-12-27 DIAGNOSIS — S069X4D Unspecified intracranial injury with loss of consciousness of 6 hours to 24 hours, subsequent encounter: Secondary | ICD-10-CM

## 2018-12-27 DIAGNOSIS — F0789 Other personality and behavioral disorders due to known physiological condition: Secondary | ICD-10-CM | POA: Diagnosis not present

## 2018-12-30 ENCOUNTER — Ambulatory Visit: Payer: Managed Care, Other (non HMO) | Admitting: Occupational Therapy

## 2018-12-30 ENCOUNTER — Ambulatory Visit: Payer: Managed Care, Other (non HMO)

## 2019-01-01 ENCOUNTER — Ambulatory Visit: Payer: Managed Care, Other (non HMO) | Admitting: Occupational Therapy

## 2019-01-01 ENCOUNTER — Ambulatory Visit: Payer: Managed Care, Other (non HMO)

## 2019-01-01 ENCOUNTER — Ambulatory Visit (INDEPENDENT_AMBULATORY_CARE_PROVIDER_SITE_OTHER): Payer: 59 | Admitting: Psychology

## 2019-01-01 ENCOUNTER — Other Ambulatory Visit: Payer: Self-pay

## 2019-01-01 DIAGNOSIS — R278 Other lack of coordination: Secondary | ICD-10-CM

## 2019-01-01 DIAGNOSIS — M6281 Muscle weakness (generalized): Secondary | ICD-10-CM

## 2019-01-01 DIAGNOSIS — R2681 Unsteadiness on feet: Secondary | ICD-10-CM

## 2019-01-01 DIAGNOSIS — F4310 Post-traumatic stress disorder, unspecified: Secondary | ICD-10-CM | POA: Diagnosis not present

## 2019-01-01 DIAGNOSIS — R2689 Other abnormalities of gait and mobility: Secondary | ICD-10-CM
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
## 2019-01-01 NOTE — Patient Instructions (Signed)
Access Code: [email or web address]  URL: [email or web address]  Date: 01/01/2019  Prepared by: Roxana Hires   Exercises Half Tandem Stance Balance with Eyes Closed - 3 reps - 30 seconds hold - 2x daily - 7x weekly Tandem Stance - 3 reps - 30 seconds hold - 2x daily - 7x weekly Half Tandem Stance Balance with Head Rotation - 3 reps - 30 seconds hold - 2x daily - 7x weekly

## 2019-01-04 ENCOUNTER — Encounter: Payer: Self-pay | Admitting: Occupational Therapy
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
## 2019-01-06 ENCOUNTER — Ambulatory Visit: Payer: Managed Care, Other (non HMO) | Admitting: Occupational Therapy

## 2019-01-06 ENCOUNTER — Ambulatory Visit: Payer: Managed Care, Other (non HMO)

## 2019-01-08 ENCOUNTER — Ambulatory Visit: Payer: Managed Care, Other (non HMO)

## 2019-01-08 ENCOUNTER — Ambulatory Visit: Payer: Managed Care, Other (non HMO) | Admitting: Occupational Therapy

## 2019-01-08 ENCOUNTER — Ambulatory Visit (INDEPENDENT_AMBULATORY_CARE_PROVIDER_SITE_OTHER): Payer: 59 | Admitting: Psychology

## 2019-01-08 DIAGNOSIS — F4310 Post-traumatic stress disorder, unspecified: Secondary | ICD-10-CM | POA: Diagnosis not present

## 2019-01-09 IMAGING — US US PELVIS COMPLETE TRANSABD/TRANSVAG
2 series · 13 of 25 positions shown · non-contrast
Comparison: Prior ultrasound from 04/24/2018

CLINICAL DATA: Follow-up examination for right adnexal cyst.



[Series 1: us pelvis complete transabd/transvag · 0.17mm/px · 12 of 61 slices shown (1 of 2)]
[im 1/61]
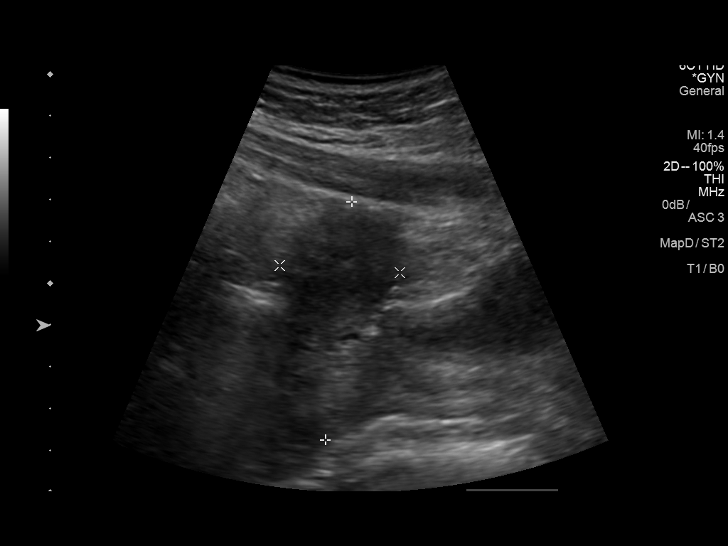
[im 6/61]
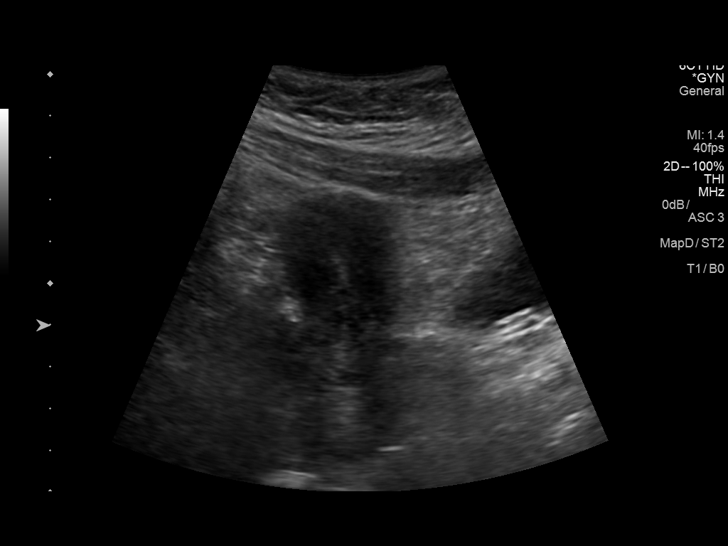
[im 11/61]
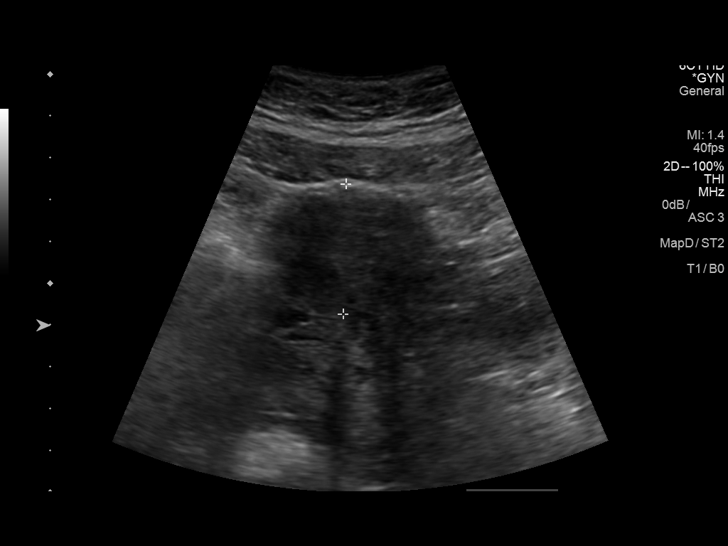
[im 17/61]
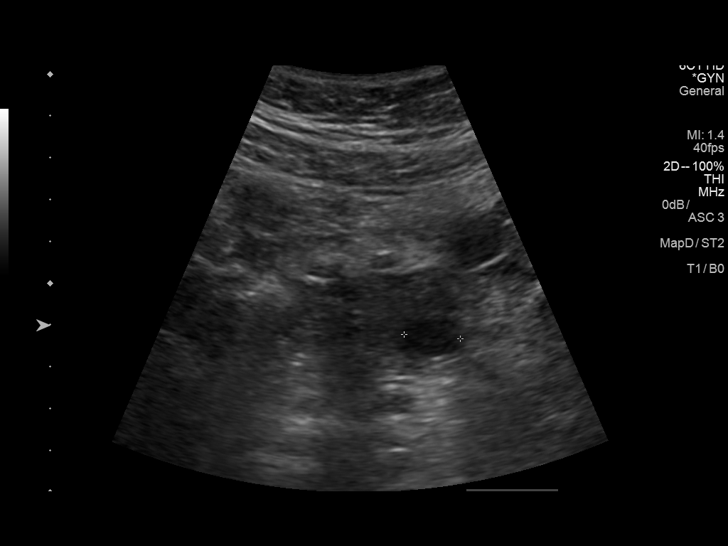
[im 22/61]
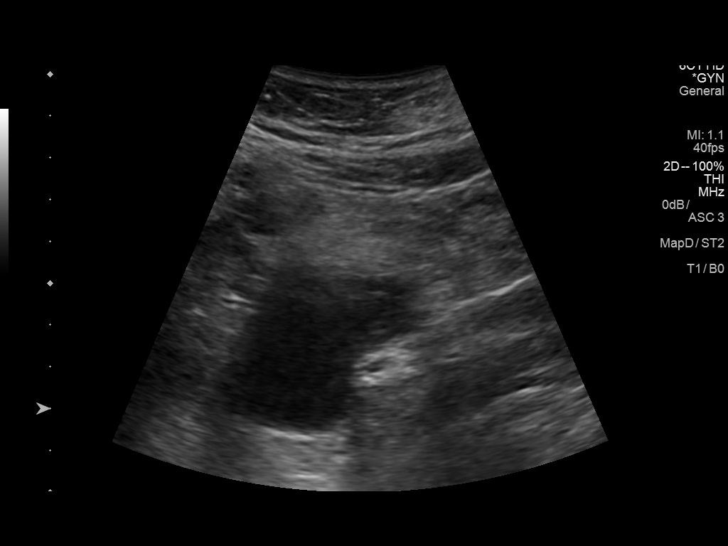
[im 28/61]
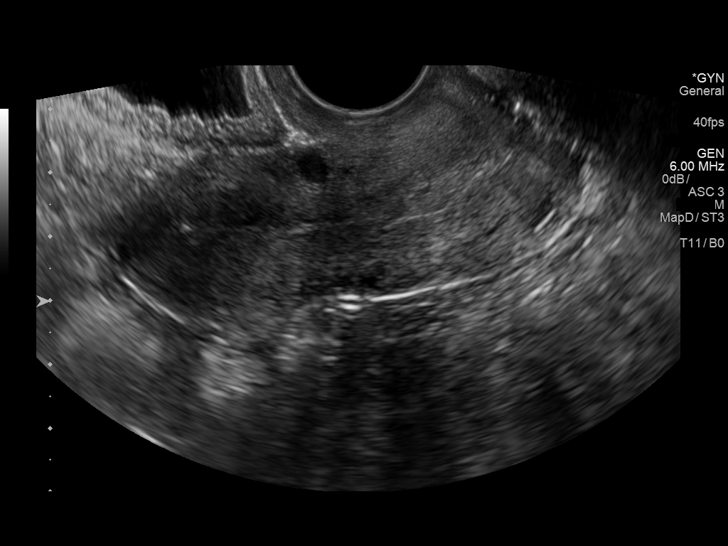
[im 33/61]
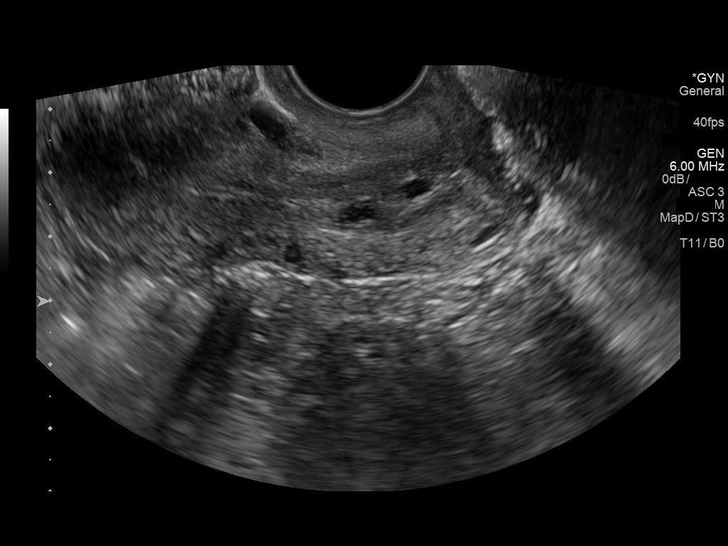
[im 39/61]
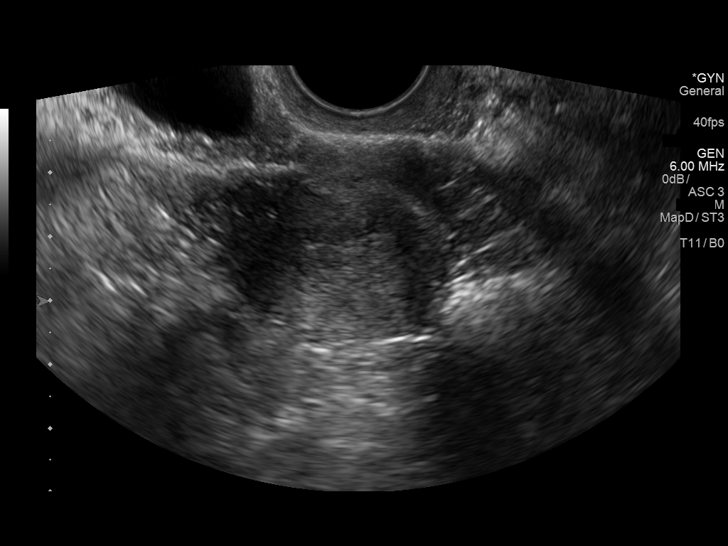
[im 44/61]
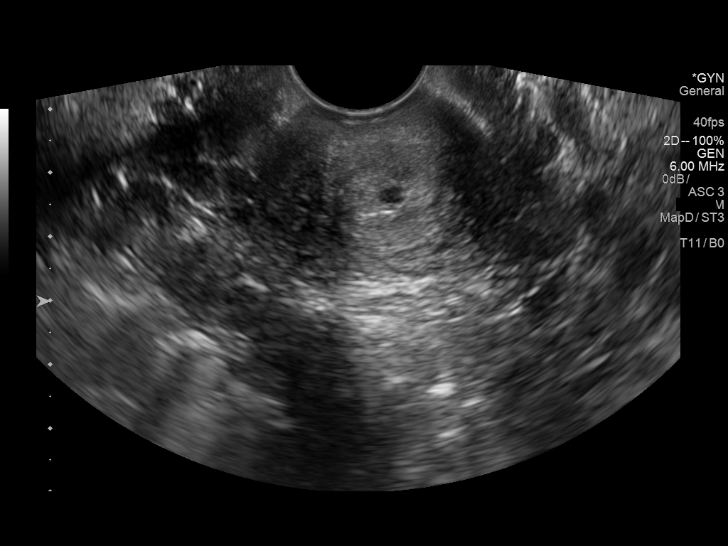
[im 50/61]
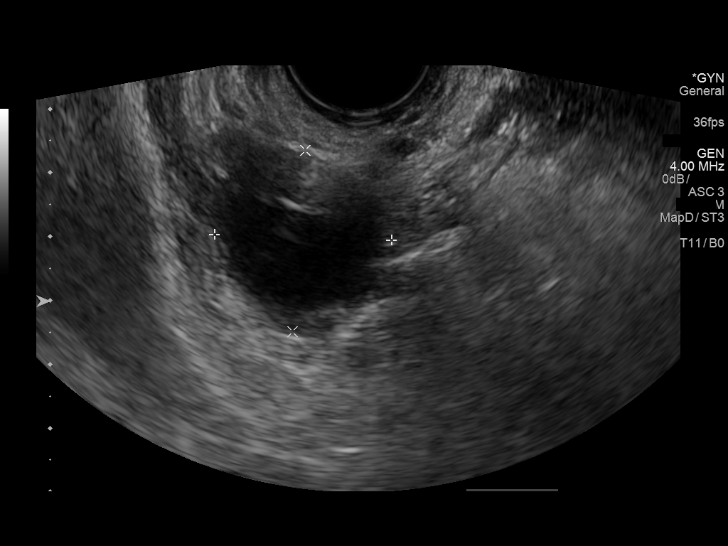
[im 55/61]
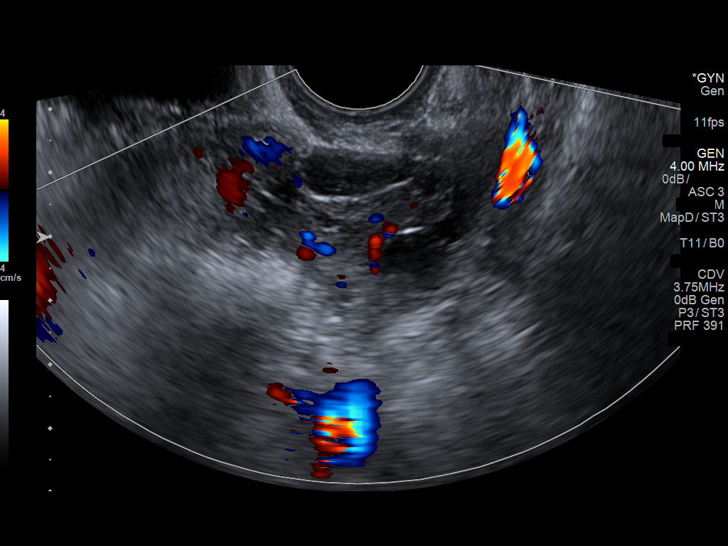
[im 61/61]
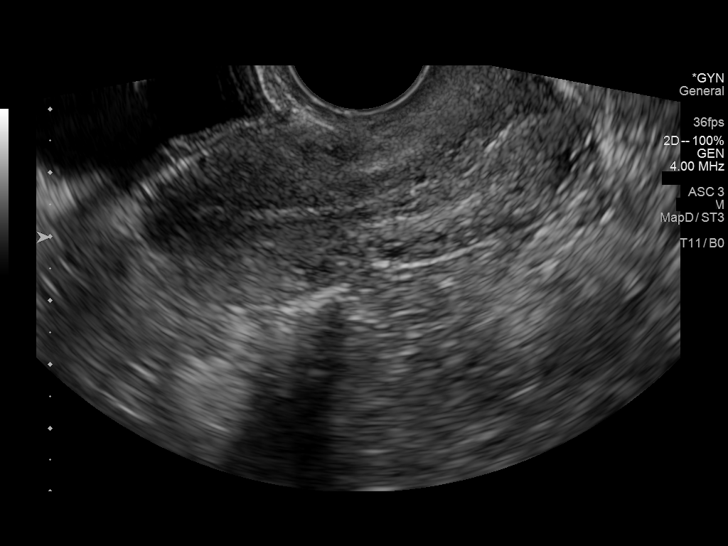

[Series 3: us pelvis complete transabd/transvag · 0.11mm/px · 1 of 6 slices shown (2 of 2)]
[im 6/6]
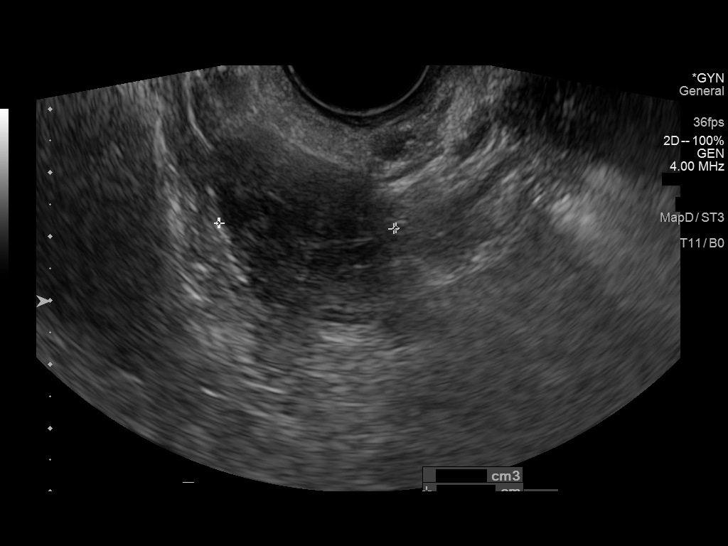

[13 of 25 positions shown; findings below may reference images not displayed]

FINDINGS: Uterus

Measurements: 5.7 x 2.9 x 3.7 cm = volume: 34 mL. No fibroids or
other mass visualized.

Endometrium

Thickness: 3.0 mm.  No focal abnormality visualized.

Right ovary

Measurements: 3.6 x 2.4 x 2.8 cm = volume: 12.5 mL. Again seen is a
mildly complicated cystic lesion adjacent to the right ovary,
measuring 2.9 x 2.8 x 2.8 cm on today's exam, relatively similar
from previous (previously 2.3 x 2.3 x 2.6 cm). No internal
vascularity or solid components.

Left ovary

Measurements: 3.3 x 2.6 x 2.5 cm = volume: 10 mL. Normal
appearance/no adnexal mass. Incidental small left hydrosalpinx
noted.

Other findings

No abnormal free fluid.
IMPRESSION: Persistent 2.9 cm complex cystic structure within the right adnexa,
little interval changed in size in appearance as compared to
previous ultrasound from 04/24/2018. Again, primary differential
considerations include a complex paraovarian or paratubal cyst or
possibly endometrioma. Given the persistence of this finding, a 1
year follow-up ultrasound to ensure stability is recommended.
Gynecologic referral for further workup and management could be
considered as well as clinically warranted.

## 2019-01-12 ENCOUNTER — Encounter: Payer: Self-pay | Admitting: Psychology

## 2019-01-12 NOTE — Progress Notes (Signed)
Today we went over much of the testing that was recently conducted as far as her cognitive functioning.  I wanted to review this information with the patient as soon as possible even though the formal time set up for me to write the full report is not until August due to scheduling issues.  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  I will go into much more detail regarding these results with the full formal neuropsychological evaluation.

## 2019-01-13 ENCOUNTER — Ambulatory Visit: Payer: Managed Care, Other (non HMO)

## 2019-01-13 ENCOUNTER — Ambulatory Visit: Payer: Managed Care, Other (non HMO) | Admitting: Occupational Therapy

## 2019-01-15 ENCOUNTER — Ambulatory Visit: Payer: Managed Care, Other (non HMO) | Attending: Orthopedic Surgery | Admitting: Occupational Therapy

## 2019-01-15 ENCOUNTER — Other Ambulatory Visit: Payer: Self-pay

## 2019-01-15 ENCOUNTER — Ambulatory Visit: Payer: Managed Care, Other (non HMO)

## 2019-01-15 ENCOUNTER — Ambulatory Visit (INDEPENDENT_AMBULATORY_CARE_PROVIDER_SITE_OTHER): Payer: 59 | Admitting: Psychology

## 2019-01-15 DIAGNOSIS — R41841 Cognitive communication deficit: Secondary | ICD-10-CM | POA: Diagnosis present

## 2019-01-15 DIAGNOSIS — R278 Other lack of coordination: Secondary | ICD-10-CM | POA: Diagnosis present

## 2019-01-15 DIAGNOSIS — M6281 Muscle weakness (generalized): Secondary | ICD-10-CM | POA: Diagnosis not present

## 2019-01-15 DIAGNOSIS — F4310 Post-traumatic stress disorder, unspecified: Secondary | ICD-10-CM | POA: Diagnosis not present

## 2019-01-17 ENCOUNTER — Encounter: Payer: Self-pay | Admitting: Occupational Therapy
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
## 2019-01-20 ENCOUNTER — Ambulatory Visit: Payer: Managed Care, Other (non HMO) | Admitting: Occupational Therapy

## 2019-01-20 ENCOUNTER — Ambulatory Visit: Payer: Managed Care, Other (non HMO)

## 2019-01-22 ENCOUNTER — Ambulatory Visit: Payer: Managed Care, Other (non HMO)

## 2019-01-22 ENCOUNTER — Ambulatory Visit: Payer: Managed Care, Other (non HMO) | Admitting: Occupational Therapy

## 2019-01-22 ENCOUNTER — Other Ambulatory Visit: Payer: Self-pay

## 2019-01-22 ENCOUNTER — Ambulatory Visit: Payer: 59 | Admitting: Psychology

## 2019-01-22 ENCOUNTER — Telehealth: Payer: Self-pay | Admitting: Psychology

## 2019-01-22 DIAGNOSIS — R41841 Cognitive communication deficit: Secondary | ICD-10-CM

## 2019-01-22 DIAGNOSIS — M6281 Muscle weakness (generalized): Secondary | ICD-10-CM | POA: Diagnosis not present

## 2019-01-22 DIAGNOSIS — R278 Other lack of coordination: Secondary | ICD-10-CM

## 2019-01-22 NOTE — Telephone Encounter (Signed)
Patient called and states since her testing she is having brain fog or fatigue  -difficulty remembering (short term) - feels worse during menstrual cycle.  She needs a phone call for assistance on what to do [phone]

## 2019-01-23 ENCOUNTER — Encounter: Payer: Self-pay | Admitting: Occupational Therapy

## 2019-01-27 ENCOUNTER — Ambulatory Visit: Payer: Managed Care, Other (non HMO)

## 2019-01-27 ENCOUNTER — Ambulatory Visit: Payer: Managed Care, Other (non HMO) | Admitting: Occupational Therapy

## 2019-01-29 ENCOUNTER — Ambulatory Visit (INDEPENDENT_AMBULATORY_CARE_PROVIDER_SITE_OTHER): Payer: 59 | Admitting: Psychology

## 2019-01-29 ENCOUNTER — Ambulatory Visit: Payer: Managed Care, Other (non HMO) | Admitting: Occupational Therapy

## 2019-01-29 ENCOUNTER — Ambulatory Visit: Payer: Managed Care, Other (non HMO)

## 2019-01-29 ENCOUNTER — Other Ambulatory Visit: Payer: Self-pay

## 2019-01-29 ENCOUNTER — Encounter: Payer: Self-pay | Admitting: Occupational Therapy

## 2019-01-29 DIAGNOSIS — F4310 Post-traumatic stress disorder, unspecified: Secondary | ICD-10-CM | POA: Diagnosis not present
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
## 2019-02-03 ENCOUNTER — Ambulatory Visit: Payer: Managed Care, Other (non HMO)

## 2019-02-03 ENCOUNTER — Ambulatory Visit: Payer: Managed Care, Other (non HMO) | Admitting: Occupational Therapy

## 2019-02-05 ENCOUNTER — Other Ambulatory Visit: Payer: Self-pay

## 2019-02-05 ENCOUNTER — Ambulatory Visit: Payer: Managed Care, Other (non HMO)

## 2019-02-05 ENCOUNTER — Ambulatory Visit (INDEPENDENT_AMBULATORY_CARE_PROVIDER_SITE_OTHER): Payer: 59 | Admitting: Psychology

## 2019-02-05 ENCOUNTER — Ambulatory Visit: Payer: Managed Care, Other (non HMO) | Admitting: Occupational Therapy

## 2019-02-05 DIAGNOSIS — M6281 Muscle weakness (generalized): Secondary | ICD-10-CM

## 2019-02-05 DIAGNOSIS — F4310 Post-traumatic stress disorder, unspecified: Secondary | ICD-10-CM | POA: Diagnosis not present

## 2019-02-10 ENCOUNTER — Ambulatory Visit: Payer: Managed Care, Other (non HMO) | Admitting: Occupational Therapy

## 2019-02-10 ENCOUNTER — Ambulatory Visit: Payer: Managed Care, Other (non HMO)

## 2019-02-12 ENCOUNTER — Ambulatory Visit (INDEPENDENT_AMBULATORY_CARE_PROVIDER_SITE_OTHER): Payer: 59 | Admitting: Psychology

## 2019-02-12 ENCOUNTER — Ambulatory Visit: Payer: 59 | Admitting: Psychology

## 2019-02-12 ENCOUNTER — Ambulatory Visit: Payer: Managed Care, Other (non HMO)

## 2019-02-12 ENCOUNTER — Encounter: Payer: Self-pay | Admitting: Occupational Therapy

## 2019-02-12 ENCOUNTER — Ambulatory Visit: Payer: Managed Care, Other (non HMO) | Admitting: Occupational Therapy

## 2019-02-12 DIAGNOSIS — F4310 Post-traumatic stress disorder, unspecified: Secondary | ICD-10-CM
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
## 2019-02-13 ENCOUNTER — Other Ambulatory Visit: Payer: Self-pay

## 2019-02-13 ENCOUNTER — Ambulatory Visit: Payer: Managed Care, Other (non HMO) | Admitting: Occupational Therapy

## 2019-02-13 DIAGNOSIS — R278 Other lack of coordination: Secondary | ICD-10-CM

## 2019-02-13 DIAGNOSIS — M6281 Muscle weakness (generalized): Secondary | ICD-10-CM | POA: Diagnosis not present

## 2019-02-14 NOTE — Therapy (Signed)
[place] MAIN [facility] SERVICES [address] [place], [place], [address] Phone: [phone]   Fax:  [phone]  Occupational Therapy Treatment  Patient Details  Name: Monica Forbes MRN: [id number] Date of Birth: [birth year] No data recorded  Encounter Date: 02/13/2019    No past medical history on file.  No past surgical history on file.  There were no vitals filed for this visit.                             OT Long Term Goals - 02/12/19 1542      OT LONG TERM GOAL #1   Title  Pt will demonstrate HEP with modified independence for LUE.    Baseline  02/05/19 continually adding exercises to perform at home each week    Time  12    Period  Weeks    Status  On-going    Target Date  04/30/19      OT LONG TERM GOAL #2   Title  Patient will demonstrate UB and LB dressing independently without modifications.     Baseline  Pt. has difficulty with shoulder extension to put on robe, coat    Time  12    Period  Weeks    Status  Achieved      OT LONG TERM GOAL #3   Title  Patient will demonstrate the ability to lift and place milk onto the refrigerator shelf with modified independence and without difficulty .    Baseline  02/05/19 difficulty with milk    Time  12    Period  Weeks    Status  New    Target Date  04/30/19      OT LONG TERM GOAL #4   Title  Patient to demonstrate ability to attend to task for 1 hour or more without rest break.    Baseline  up to 45 mins on 7/22    Time  6    Period  Weeks    Status  New    Target Date  03/19/19      OT LONG TERM GOAL #6   Title  Patient will demonstrate ability to lift and place carry on bag into overhead bin for travel with modified independence.    Baseline  patient has difficulty with lifting gallon of milk, she has not traveled due to COVID    Time  12    Period  Weeks    Status  On-going    Target Date  04/30/19      OT LONG TERM GOAL #7   Title  Patient  will be able to reach to retrieve items out of low cabinets with good balance.      Baseline  09/26/2018: Improving, Pt. has difficulty reaching low into cabinets. 11/07/18 continued difficulty with items low and high.    Time  12    Period  Weeks    Status  Achieved      OT LONG TERM GOAL #8   Title  Patient will demonstrate ability to reach out with left UE to manage ATM transaction with modified independence without difficulty     Baseline  08/27/2018: Improving. Pt. continues to have difficulty. 11/07/18:  continued difficulty    Time  12    Status  Achieved      OT LONG TERM GOAL  #9   Baseline  Patient will demonstrate sufficient ROM in left  shoulder and elbow to wash  her back using a loofah with no difficulty.    Time  12    Period  Weeks    Status  New    Target Date  04/30/19      OT LONG TERM GOAL  #10   TITLE  Patient will complete small buttons on a shirt with modified independence with good speed and dexterity.    Baseline  09/26/2018: Pt. has difficulty with small buttons, 11/07/18 slow to complete, 7/22 has not used buttons in the last couple months.    Time  12    Period  Weeks    Status  On-going      OT LONG TERM GOAL  #11   TITLE  Patient will attend to focused task for 45 mins without rest break to complete work task.      Baseline  09/26/2018: Pt. continues to present with decreased attention to details around 30 mins for tasks.  11/07/18 Fatigue noted after 20 minutes into session.    Period  Weeks    Status  Achieved      OT LONG TERM GOAL  #12   TITLE  Pt. will independently be able to apply a fitted sheet when making the bed with modified independence.    Baseline  02/05/19 still has difficulty with this task    Time  12    Period  Weeks    Status  On-going    Target Date  04/30/19              Patient will benefit from skilled therapeutic intervention in order to improve the following deficits and impairments:           Visit Diagnosis: 1.  Muscle weakness (generalized)   2. Other lack of coordination       Problem List There are no active problems to display for this patient.   Lovett,Amy 02/14/2019, 6:06 PM  [place] MAIN [facility] SERVICES [address] [place], [place], [address] Phone: [phone]   Fax:  [phone]  Name: Monica Forbes MRN: [id number] Date of Birth: June 09, 1996

## 2019-02-17 ENCOUNTER — Ambulatory Visit: Payer: BC Managed Care – PPO

## 2019-02-17 ENCOUNTER — Ambulatory Visit: Payer: BC Managed Care – PPO | Attending: Orthopedic Surgery | Admitting: Occupational Therapy

## 2019-02-17 ENCOUNTER — Other Ambulatory Visit: Payer: Self-pay

## 2019-02-17 DIAGNOSIS — R2681 Unsteadiness on feet: Secondary | ICD-10-CM

## 2019-02-17 DIAGNOSIS — M6281 Muscle weakness (generalized): Secondary | ICD-10-CM

## 2019-02-17 DIAGNOSIS — M25551 Pain in right hip: Secondary | ICD-10-CM | POA: Diagnosis not present

## 2019-02-17 DIAGNOSIS — R278 Other lack of coordination: Secondary | ICD-10-CM

## 2019-02-17 DIAGNOSIS — R2689 Other abnormalities of gait and mobility: Secondary | ICD-10-CM | POA: Diagnosis present
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
## 2019-02-18 ENCOUNTER — Encounter: Payer: Self-pay | Admitting: Occupational Therapy
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
## 2019-02-19 ENCOUNTER — Ambulatory Visit: Payer: BC Managed Care – PPO | Admitting: Physical Therapy

## 2019-02-19 ENCOUNTER — Ambulatory Visit: Payer: BC Managed Care – PPO | Admitting: Occupational Therapy

## 2019-02-19 ENCOUNTER — Ambulatory Visit: Payer: 59 | Admitting: Psychology

## 2019-02-19 ENCOUNTER — Encounter: Payer: Self-pay | Admitting: Occupational Therapy
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
## 2019-02-24 ENCOUNTER — Ambulatory Visit: Payer: BC Managed Care – PPO | Admitting: Physical Therapy

## 2019-02-24 ENCOUNTER — Encounter: Payer: Managed Care, Other (non HMO) | Admitting: Occupational Therapy

## 2019-02-26 ENCOUNTER — Other Ambulatory Visit: Payer: Self-pay

## 2019-02-26 ENCOUNTER — Ambulatory Visit: Payer: 59 | Admitting: Psychology

## 2019-02-26 ENCOUNTER — Ambulatory Visit: Payer: BC Managed Care – PPO

## 2019-02-26 ENCOUNTER — Ambulatory Visit: Payer: BC Managed Care – PPO | Admitting: Occupational Therapy

## 2019-02-26 ENCOUNTER — Encounter: Payer: Self-pay | Admitting: Occupational Therapy

## 2019-02-26 DIAGNOSIS — R2681 Unsteadiness on feet: Secondary | ICD-10-CM

## 2019-02-26 DIAGNOSIS — M6281 Muscle weakness (generalized): Secondary | ICD-10-CM

## 2019-02-26 DIAGNOSIS — R2689 Other abnormalities of gait and mobility: Secondary | ICD-10-CM

## 2019-02-26 DIAGNOSIS — R278 Other lack of coordination: Secondary | ICD-10-CM
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
## 2019-03-03 ENCOUNTER — Other Ambulatory Visit: Payer: Self-pay

## 2019-03-03 ENCOUNTER — Encounter: Payer: Self-pay | Admitting: Physical Therapy

## 2019-03-03 ENCOUNTER — Ambulatory Visit: Payer: BC Managed Care – PPO | Admitting: Physical Therapy

## 2019-03-03 ENCOUNTER — Ambulatory Visit: Payer: BC Managed Care – PPO | Admitting: Occupational Therapy

## 2019-03-03 DIAGNOSIS — M6281 Muscle weakness (generalized): Secondary | ICD-10-CM

## 2019-03-03 DIAGNOSIS — R2689 Other abnormalities of gait and mobility: Secondary | ICD-10-CM

## 2019-03-03 DIAGNOSIS — R278 Other lack of coordination: Secondary | ICD-10-CM

## 2019-03-03 DIAGNOSIS — R2681 Unsteadiness on feet: Secondary | ICD-10-CM
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
## 2019-03-04 ENCOUNTER — Ambulatory Visit (INDEPENDENT_AMBULATORY_CARE_PROVIDER_SITE_OTHER): Payer: 59 | Admitting: Psychology

## 2019-03-04 DIAGNOSIS — F4310 Post-traumatic stress disorder, unspecified: Secondary | ICD-10-CM | POA: Diagnosis not present

## 2019-03-05 ENCOUNTER — Encounter: Payer: Self-pay | Admitting: Occupational Therapy

## 2019-03-05 ENCOUNTER — Ambulatory Visit: Payer: BC Managed Care – PPO | Admitting: Occupational Therapy

## 2019-03-05 ENCOUNTER — Ambulatory Visit: Payer: BC Managed Care – PPO
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
## 2019-03-10 ENCOUNTER — Encounter: Payer: Managed Care, Other (non HMO) | Admitting: Occupational Therapy

## 2019-03-10 ENCOUNTER — Ambulatory Visit: Payer: Managed Care, Other (non HMO)

## 2019-03-11 ENCOUNTER — Encounter: Payer: Self-pay | Admitting: Psychology

## 2019-03-11 ENCOUNTER — Other Ambulatory Visit: Payer: Self-pay

## 2019-03-11 ENCOUNTER — Ambulatory Visit: Payer: BC Managed Care – PPO | Admitting: Occupational Therapy

## 2019-03-11 ENCOUNTER — Encounter: Payer: BC Managed Care – PPO | Attending: Psychology | Admitting: Psychology

## 2019-03-11 DIAGNOSIS — F0789 Other personality and behavioral disorders due to known physiological condition: Secondary | ICD-10-CM

## 2019-03-11 DIAGNOSIS — S069X4D Unspecified intracranial injury with loss of consciousness of 6 hours to 24 hours, subsequent encounter: Secondary | ICD-10-CM | POA: Diagnosis not present

## 2019-03-11 DIAGNOSIS — S069X4S Unspecified intracranial injury with loss of consciousness of 6 hours to 24 hours, sequela: Secondary | ICD-10-CM | POA: Diagnosis not present

## 2019-03-11 DIAGNOSIS — M6281 Muscle weakness (generalized): Secondary | ICD-10-CM | POA: Diagnosis not present

## 2019-03-11 DIAGNOSIS — F09 Unspecified mental disorder due to known physiological condition: Secondary | ICD-10-CM | POA: Diagnosis not present

## 2019-03-11 DIAGNOSIS — R278 Other lack of coordination: Secondary | ICD-10-CM

## 2019-03-11 DIAGNOSIS — R4189 Other symptoms and signs involving cognitive functions and awareness: Secondary | ICD-10-CM | POA: Diagnosis not present

## 2019-03-11 NOTE — Progress Notes (Signed)
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  Subtest Subtest Scaled Score Mean Scaled Score Difference Critical Value .05 Strength or Weakness Base Rate  Block Design 6 8.90 -2.90 2.85 W 15-25%  Similarities 14 8.90 5.10 2.82 S <1%  Digit Span 6 8.90 -2.90 2.22 W 15-25%  Matrix Reasoning 8 8.90 -0.90 2.54  >25%  Vocabulary 14 8.90 5.10 2.03 S 1-2%  Arithmetic 7 8.90 -1.90 2.73  >25%  Symbol Search 9 8.90 0.10 3.42  >25%  Visual Puzzles 7 8.90 -1.90 2.71  >25%  Information 10 8.90 1.10 2.19  >25%  Coding 8 8.90 -0.90 2.97  >25%  Overall: Mean = 8.90, Scatter =  8, Base rate =  30.2 Base Rate for Intersubtest Scatter is reported for 10 Subtests. Statistical significance (critical value) at the .05 level.   PROCESS ANALYSIS   Perceptual Reasoning Process Score Summary  Process Score Raw Score Scaled Score Percentile Rank SEM  Block Design No Time Bonus 28 7 16  1.31    Working Memory Process Score Summary  Process Score Raw Score Scaled Score Percentile Rank Base Rate SEM  Digit Span Forward 9 8 25   --  1.34  Digit Span Backward 5 6 9  -- 1.34  Digit Span Sequencing 6 6 9  -- 1.27  Longest Digit Span Forward 6 -- -- 82.0 --  Longest Digit Span Backward 3 -- -- 99.0 --  Longest Digit Span Sequence 4 -- -- 97.0 --    Process Level Discrepancy Comparisons  Process Comparison Score 1 Score 2 Difference Critical Value .05 Significant Difference Y/N Base Rate  Block Design - Block Design No Time Bonus 6 7 -1 3.08 N 24.0  Digit Span Forward - Digit Span Backward 8 6 2  3.65 N 27.2  Digit Span Forward - Digit Span Sequencing 8 6 2  3.60 N 31.3  Digit Span Backward - Digit Span Sequencing 6 6 0 3.56 N   Longest Digit Span Forward - Longest Digit Span Backward 6 3 3  -- -- 32.0  Longest Digit Span Forward - Longest Digit Span Sequence 6 4 2  -- -- 34.5  Longest Digit Span Backward - Longest Digit Span Sequence 3 4 -1 -- -- 64.5  Statistical significance (critical value) at the .05 level.   Raw Scores  Subtest Score Range Raw Score  Process Score Range Raw Score  Block Design 0-66 28  Block Design No Time Bonus 0-48 28  Similarities 0-36 31  Digit Span Forward 0-16 9  Digit Span 0-48 20  Digit Span Backward 0-16 5  Matrix Reasoning 0-26 15  Digit Span Sequencing 0-16 6  Vocabulary 0-57 45  Longest Digit Span Forward 0, 2-9 6  Arithmetic 0-22 10  Longest Digit Span Backward 0, 2-8 3  Symbol Search 0-60 32  Longest Digit Span Sequence 0, 2-9 4  Visual Puzzles 0-26 10  Longest Letter-Number Seq. 0, 2-8   Information 0-26 13      Coding 0-135 62      Letter-Number Seq. 0-30       Figure Weights 0-27       Comprehension 0-36       Cancellation 0-72       Picture Completion 0-24           WMS-IV Wechsler Memory Scale-Fourth Edition Score Report   Examinee Name Tawney Breitman  Date of Report 2019/03/11  Kingsley Spittle ID [id number]  Years of Education Not Specified  Date of Birth 06-19-1996  Home Language   Gender Female  Handedness Not Specified  Race/Ethnicity   Examiner Name Ilean Skill  Date of Testing 2018/12/20  Age at Testing 22 years 9 months  Retest? No   Comments: Index Score Summary  Index Sum of Scaled Scores Index Score Percentile Rank 95% Confidence Interval Qualitative Descriptor  Auditory Memory (AMI) 45 107 68 100-113 Average  Visual Memory (VMI) 26 80 9 75-86 Low Average  Visual Working Memory (VWMI) 13 80 9 74-89 Low Average  Immediate Memory (IMI) 34 89 23 83-96 Low Average  Delayed Memory (DMI) 37 94 34 88-101 Average    Index Score Profile  The vertical bars represent the standard error of measurement (SEM).   Primary Subtest Scaled Score Summary  Subtest Domain Raw Score Scaled Score Percentile Rank  Logical Memory I AM 34 13 84  Logical Memory II AM 33 14 91  Verbal Paired Associates I AM 30 8 25   Verbal Paired Associates II AM 12 10 50  Designs I VM 53 5 5  Designs II VM 45 6 9  Visual Reproduction I VM 35 8 25  Visual Reproduction II VM 19 7 16   Spatial Addition VWM  11 7 16   Symbol Span VWM 15 6 9    Primary Subtest Scaled Score Profile    PROCESS SCORE CONVERSIONS  Auditory Memory Process Score Summary  Process Score Raw Score Scaled Score Percentile Rank Cumulative Percentage (Base Rate)  LM II Recognition 29 - - >75%  VPA II Recognition 39 - - 26-50%   Visual Memory Process Score Summary  Process Score Raw Score Scaled Score Percentile Rank Cumulative Percentage (Base Rate)  DE I Content 34 8 25 -  DE I Spatial 11 4 2  -  DE II Content 27 6 9  -  DE II Spatial 14 10 50 -  DE II Recognition 16 - - 26-50%  VR II Recognition 6 - - 26-50%    SUBTEST-LEVEL DIFFERENCES WITHIN INDEXES  Auditory Memory Index  Subtest Scaled Score AMI Mean Score Difference from Mean Critical Value Base Rate  Logical Memory I 13 11.25 1.75 2.64 >25%  Logical Memory II 14 11.25 2.75 2.48 5-10%  Verbal Paired Associates I 8 11.25 -3.25 1.90 5-10%  Verbal Paired Associates II 10 11.25 -1.25 2.48 >25%  Statistical significance  (critical value) at the .05 level.   Visual Memory Index  Subtest Scaled Score VMI Mean Score Difference from Mean Critical Value Base Rate  Designs I 5 6.50 -1.50 2.38 >25%  Designs II 6 6.50 -0.50 2.38 >25%  Visual Reproduction I 8 6.50 1.50 1.86 >25%  Visual Reproduction II 7 6.50 0.50 1.48 >25%  Statistical significance (critical value) at the .05 level.   Immediate Memory Index  Subtest Scaled Score IMI Mean Score Difference from Mean Critical Value Base Rate  Logical Memory I 13 8.50 4.50 2.59 5%  Verbal Paired Associates I 8 8.50 -0.50 1.82 >25%  Designs I 5 8.50 -3.50 2.42 10%  Visual Reproduction I 8 8.50 -0.50 1.91 >25%  Statistical significance (critical value) at the .05 level.   Delayed Memory Index  Subtest Scaled Score DMI Mean Score Difference from Mean Critical Value Base Rate  Logical Memory II 14 9.25 4.75 2.44 2-5%  Verbal Paired Associates II 10 9.25 0.75 2.44 >25%  Designs II 6 9.25 -3.25 2.44 10-15%  Visual Reproduction II 7 9.25 -2.25 1.57 25%  Statistical significance (critical value) at the .05 level.    SUBTEST DISCREPANCY COMPARISON  Comparison Score 1 Score 2 Difference Critical Value Base Rate  Spatial Addition - Symbol Span 7 6 1  2.74 85.9  Statistical significance (critical value) at the .05 level.    SUBTEST-LEVEL CONTRAST SCALED SCORES  Logical Memory  Score Score 1 Score 2 Contrast Scaled Score  LM II Recognition vs. Delayed Recall >75% 14 13  LM Immediate Recall vs. Delayed Recall 13 14 12    Verbal Paired Associates  Score Score 1 Score 2 Contrast Scaled Score  VPA II Recognition vs. Delayed Recall 26-50% 10 10  VPA Immediate Recall vs. Delayed Recall 8 10 13    Designs  Score Score 1 Score 2 Contrast Scaled Score  DE I Spatial vs. Content 4 8 11   DE II Spatial vs. Content 10 6 5   DE II Recognition vs. Delayed Recall 26-50% 6 6  DE Immediate Recall vs. Delayed Recall 5 6 9    Visual Reproduction  Score Score 1 Score 2 Contrast  Scaled Score  VR II Recognition vs. Delayed Recall 26-50% 7 7  VR Immediate Recall vs. Delayed Recall 8 7 8     INDEX-LEVEL CONTRAST SCALED SCORES  WMS-IV Indexes  Score Score 1 Score 2 Contrast  Scaled Score  Auditory Memory Index vs. Visual Memory Index 107 80 5  Visual Working Memory Index vs. Visual Memory Index 80 80 8  Immediate Memory Index vs. Delayed Memory Index 89 94 12    RAW SCORES  Subtest Score Range Adult Score Range Older Adult Raw Score  Brief Cognitive Status Exam 0-58 0-58   Logical Memory I 0-50 0-53 34  Logical Memory II 0-50 0-39 33  Verbal Paired Associates I 0-56 0-40 30  Verbal Paired Associates II 0-14 0-10 12  CVLT-II Trials 1-5 5-95 5-95   CVLT-II Long-Delay -5-5 -5-5   Designs I 0-120  53  Designs II 0-120  45  Visual Reproduction I 0-43 0-43 35  Visual Reproduction II 0-43 0-43 19  Spatial Addition 0-24  11  Symbol Span 0-50 0-50 15  Process Score Range Adult Score Range Older Adult Raw Score  LM II Recognition 0-30 0-23 29  VPA II Recognition 0-40 0-30 39  VPA II Word Recall 0-28 0-20   DE I Content 0-48  34  DE I Spatial 0-24  11  DE II Content 0-48  27  DE II Spatial 0-24  14  DE II Recognition 0-24  16  VR II Recognition 0-7 0-7 6  VR II Copy 0-43 0-43     ABILITY-MEMORY ANALYSIS  Ability Score:  VCI: 114 Date of Testing:  WAIS-IV; WMS-IV 2018/12/20  Predicted Difference Method   Index Predicted WMS-IV Index Score Actual WMS-IV Index Score Difference Critical Value  Significant Difference Y/N Base Rate  Auditory Memory 107 107 0 9.64 N   Visual Memory 106 80 26 8.87 Y 3%  Visual Working Memory 108 80 28 10.59 Y 1-2%  Immediate Memory 108 89 19 9.93 Y 5-10%  Delayed Memory 107 94 13 9.68 Y 15-20%  Statistical significance (critical value) at the .01 level.   Contrast Scaled Scores  Score Score 1 Score 2 Contrast Scaled Score  General Ability Index vs. Auditory Memory Index 99 107 12  General Ability Index vs.  Visual Memory Index 99 80 5  General Ability Index vs. Visual Working Memory Index 99 80 5  General Ability Index vs. Immediate Memory Index 99 89 7  General Ability Index vs. Delayed Memory Index 99 94 9  Verbal Comprehension Index vs. Auditory Memory Index 114 107 11  Perceptual Reasoning Index vs. Visual Memory Index 82 80 8  Perceptual Reasoning Index vs. Visual Working Memory Index 82 80 9  Working Memory Index vs. Auditory Memory Index 80 107 14  Working Memory Index vs. Visual Working Memory Index 80 80 8    End of Report

## 2019-03-12 ENCOUNTER — Encounter: Payer: Managed Care, Other (non HMO) | Admitting: Occupational Therapy

## 2019-03-12 ENCOUNTER — Ambulatory Visit: Payer: Managed Care, Other (non HMO) | Admitting: Physical Therapy

## 2019-03-12 ENCOUNTER — Ambulatory Visit (INDEPENDENT_AMBULATORY_CARE_PROVIDER_SITE_OTHER): Payer: 59 | Admitting: Psychology

## 2019-03-12 ENCOUNTER — Ambulatory Visit: Payer: 59 | Admitting: Psychology

## 2019-03-12 DIAGNOSIS — F4310 Post-traumatic stress disorder, unspecified: Secondary | ICD-10-CM | POA: Diagnosis not present

## 2019-03-15 ENCOUNTER — Encounter: Payer: Self-pay | Admitting: Occupational Therapy
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
## 2019-03-17 ENCOUNTER — Encounter: Payer: Managed Care, Other (non HMO) | Admitting: Occupational Therapy

## 2019-03-17 ENCOUNTER — Ambulatory Visit: Payer: Managed Care, Other (non HMO)

## 2019-03-18 ENCOUNTER — Ambulatory Visit: Payer: BC Managed Care – PPO | Admitting: Occupational Therapy

## 2019-03-19 ENCOUNTER — Ambulatory Visit: Payer: Managed Care, Other (non HMO)

## 2019-03-19 ENCOUNTER — Encounter: Payer: Managed Care, Other (non HMO) | Admitting: Occupational Therapy

## 2019-03-20 ENCOUNTER — Encounter: Payer: Self-pay | Admitting: Occupational Therapy

## 2019-03-20 ENCOUNTER — Ambulatory Visit: Payer: BC Managed Care – PPO | Attending: Orthopedic Surgery

## 2019-03-20 ENCOUNTER — Other Ambulatory Visit: Payer: Self-pay

## 2019-03-20 ENCOUNTER — Ambulatory Visit (INDEPENDENT_AMBULATORY_CARE_PROVIDER_SITE_OTHER): Payer: 59 | Admitting: Psychology

## 2019-03-20 DIAGNOSIS — R2681 Unsteadiness on feet: Secondary | ICD-10-CM

## 2019-03-20 DIAGNOSIS — F4310 Post-traumatic stress disorder, unspecified: Secondary | ICD-10-CM

## 2019-03-20 DIAGNOSIS — R2689 Other abnormalities of gait and mobility: Secondary | ICD-10-CM

## 2019-03-20 DIAGNOSIS — M6281 Muscle weakness (generalized): Secondary | ICD-10-CM | POA: Diagnosis not present
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
## 2019-03-26 ENCOUNTER — Encounter: Payer: Managed Care, Other (non HMO) | Admitting: Occupational Therapy

## 2019-03-26 ENCOUNTER — Ambulatory Visit: Payer: Managed Care, Other (non HMO)

## 2019-03-26 ENCOUNTER — Ambulatory Visit (INDEPENDENT_AMBULATORY_CARE_PROVIDER_SITE_OTHER): Payer: 59 | Admitting: Psychology

## 2019-03-26 DIAGNOSIS — F4310 Post-traumatic stress disorder, unspecified: Secondary | ICD-10-CM

## 2019-03-27 ENCOUNTER — Ambulatory Visit: Payer: 59

## 2019-03-27 ENCOUNTER — Ambulatory Visit: Payer: BC Managed Care – PPO | Admitting: Occupational Therapy

## 2019-03-31 ENCOUNTER — Ambulatory Visit: Payer: Managed Care, Other (non HMO)

## 2019-03-31 ENCOUNTER — Encounter: Payer: Managed Care, Other (non HMO) | Admitting: Occupational Therapy

## 2019-04-02 ENCOUNTER — Ambulatory Visit (INDEPENDENT_AMBULATORY_CARE_PROVIDER_SITE_OTHER): Payer: Managed Care, Other (non HMO) | Admitting: Psychology

## 2019-04-02 ENCOUNTER — Encounter: Payer: Managed Care, Other (non HMO) | Admitting: Occupational Therapy

## 2019-04-02 ENCOUNTER — Ambulatory Visit: Payer: Managed Care, Other (non HMO)

## 2019-04-02 DIAGNOSIS — F4310 Post-traumatic stress disorder, unspecified: Secondary | ICD-10-CM | POA: Diagnosis not present

## 2019-04-03 ENCOUNTER — Ambulatory Visit: Payer: 59

## 2019-04-03 ENCOUNTER — Encounter: Payer: 59 | Admitting: Occupational Therapy

## 2019-04-07 ENCOUNTER — Ambulatory Visit: Payer: Managed Care, Other (non HMO)

## 2019-04-07 ENCOUNTER — Encounter: Payer: 59 | Admitting: Occupational Therapy

## 2019-04-07 ENCOUNTER — Encounter: Payer: Managed Care, Other (non HMO) | Admitting: Occupational Therapy

## 2019-04-08 ENCOUNTER — Encounter: Payer: 59 | Admitting: Occupational Therapy

## 2019-04-09 ENCOUNTER — Encounter: Payer: Managed Care, Other (non HMO) | Admitting: Occupational Therapy

## 2019-04-09 ENCOUNTER — Ambulatory Visit (INDEPENDENT_AMBULATORY_CARE_PROVIDER_SITE_OTHER): Payer: 59 | Admitting: Psychology

## 2019-04-09 ENCOUNTER — Ambulatory Visit: Payer: Managed Care, Other (non HMO)

## 2019-04-09 DIAGNOSIS — F4310 Post-traumatic stress disorder, unspecified: Secondary | ICD-10-CM

## 2019-04-10 ENCOUNTER — Ambulatory Visit: Payer: 59

## 2019-04-14 ENCOUNTER — Encounter: Payer: Managed Care, Other (non HMO) | Admitting: Occupational Therapy

## 2019-04-14 ENCOUNTER — Ambulatory Visit: Payer: Managed Care, Other (non HMO)

## 2019-04-16 ENCOUNTER — Encounter: Payer: Managed Care, Other (non HMO) | Admitting: Occupational Therapy

## 2019-04-16 ENCOUNTER — Ambulatory Visit (INDEPENDENT_AMBULATORY_CARE_PROVIDER_SITE_OTHER): Payer: 59 | Admitting: Psychology

## 2019-04-16 ENCOUNTER — Ambulatory Visit: Payer: Managed Care, Other (non HMO)

## 2019-04-16 DIAGNOSIS — F4310 Post-traumatic stress disorder, unspecified: Secondary | ICD-10-CM

## 2019-04-17 ENCOUNTER — Ambulatory Visit: Payer: 59

## 2019-04-17 ENCOUNTER — Encounter: Payer: 59 | Admitting: Occupational Therapy

## 2019-04-22 ENCOUNTER — Ambulatory Visit: Payer: 59

## 2019-04-22 ENCOUNTER — Encounter: Payer: 59 | Admitting: Occupational Therapy

## 2019-04-23 ENCOUNTER — Ambulatory Visit (INDEPENDENT_AMBULATORY_CARE_PROVIDER_SITE_OTHER): Payer: 59 | Admitting: Psychology

## 2019-04-23 DIAGNOSIS — F4310 Post-traumatic stress disorder, unspecified: Secondary | ICD-10-CM | POA: Diagnosis not present

## 2019-04-29 ENCOUNTER — Ambulatory Visit: Payer: 59

## 2019-04-30 ENCOUNTER — Ambulatory Visit (INDEPENDENT_AMBULATORY_CARE_PROVIDER_SITE_OTHER): Payer: 59 | Admitting: Psychology

## 2019-04-30 DIAGNOSIS — F4310 Post-traumatic stress disorder, unspecified: Secondary | ICD-10-CM

## 2019-05-06 ENCOUNTER — Ambulatory Visit: Payer: 59

## 2019-05-07 ENCOUNTER — Ambulatory Visit (INDEPENDENT_AMBULATORY_CARE_PROVIDER_SITE_OTHER): Payer: 59 | Admitting: Psychology

## 2019-05-07 DIAGNOSIS — F4310 Post-traumatic stress disorder, unspecified: Secondary | ICD-10-CM | POA: Diagnosis not present

## 2019-05-13 ENCOUNTER — Encounter: Payer: 59 | Admitting: Occupational Therapy

## 2019-05-13 ENCOUNTER — Ambulatory Visit: Payer: 59

## 2019-05-14 ENCOUNTER — Ambulatory Visit (INDEPENDENT_AMBULATORY_CARE_PROVIDER_SITE_OTHER): Payer: 59 | Admitting: Psychology

## 2019-05-14 DIAGNOSIS — F4310 Post-traumatic stress disorder, unspecified: Secondary | ICD-10-CM

## 2019-05-21 ENCOUNTER — Ambulatory Visit (INDEPENDENT_AMBULATORY_CARE_PROVIDER_SITE_OTHER): Payer: 59 | Admitting: Psychology

## 2019-05-21 DIAGNOSIS — F4310 Post-traumatic stress disorder, unspecified: Secondary | ICD-10-CM | POA: Diagnosis not present

## 2019-05-22 ENCOUNTER — Ambulatory Visit: Payer: 59

## 2019-05-22 ENCOUNTER — Encounter: Payer: 59 | Admitting: Occupational Therapy

## 2019-05-28 ENCOUNTER — Ambulatory Visit (INDEPENDENT_AMBULATORY_CARE_PROVIDER_SITE_OTHER): Payer: 59 | Admitting: Psychology

## 2019-05-28 DIAGNOSIS — F4310 Post-traumatic stress disorder, unspecified: Secondary | ICD-10-CM | POA: Diagnosis not present

## 2019-05-29 ENCOUNTER — Encounter: Payer: 59 | Admitting: Occupational Therapy

## 2019-05-29 ENCOUNTER — Ambulatory Visit: Payer: 59

## 2019-06-04 ENCOUNTER — Ambulatory Visit (INDEPENDENT_AMBULATORY_CARE_PROVIDER_SITE_OTHER): Payer: Managed Care, Other (non HMO) | Admitting: Family Medicine

## 2019-06-04 ENCOUNTER — Encounter: Payer: Self-pay | Admitting: Family Medicine

## 2019-06-04 ENCOUNTER — Ambulatory Visit (INDEPENDENT_AMBULATORY_CARE_PROVIDER_SITE_OTHER): Payer: 59 | Admitting: Psychology

## 2019-06-04 ENCOUNTER — Other Ambulatory Visit: Payer: Self-pay

## 2019-06-04 VITALS — BP 104/62 | HR 83 | Temp 98.3°F | Ht 68.0 in | Wt 222.2 lb

## 2019-06-04 DIAGNOSIS — S49102S Unspecified physeal fracture of lower end of humerus, left arm, sequela: Secondary | ICD-10-CM

## 2019-06-04 DIAGNOSIS — S069X9A Unspecified intracranial injury with loss of consciousness of unspecified duration, initial encounter: Secondary | ICD-10-CM

## 2019-06-04 DIAGNOSIS — F4310 Post-traumatic stress disorder, unspecified: Secondary | ICD-10-CM | POA: Diagnosis not present

## 2019-06-04 DIAGNOSIS — N920 Excessive and frequent menstruation with regular cycle: Secondary | ICD-10-CM | POA: Diagnosis not present

## 2019-06-04 DIAGNOSIS — S069X9S Unspecified intracranial injury with loss of consciousness of unspecified duration, sequela: Secondary | ICD-10-CM

## 2019-06-04 DIAGNOSIS — R2689 Other abnormalities of gait and mobility: Secondary | ICD-10-CM | POA: Diagnosis not present

## 2019-06-04 DIAGNOSIS — S0990XS Unspecified injury of head, sequela: Secondary | ICD-10-CM | POA: Insufficient documentation

## 2019-06-04 DIAGNOSIS — Z23 Encounter for immunization: Secondary | ICD-10-CM

## 2019-06-04 HISTORY — DX: Excessive and frequent menstruation with regular cycle: N92.0

## 2019-06-04 LAB — CBC
HCT: 38.8 % (ref 36.0–46.0)
Hemoglobin: 12.7 g/dL (ref 12.0–15.0)
MCHC: 32.8 g/dL (ref 30.0–36.0)
MCV: 83.7 fl (ref 78.0–100.0)
Platelets: 251 10*3/uL (ref 150.0–400.0)
RBC: 4.64 Mil/uL (ref 3.87–5.11)
RDW: 13.4 % (ref 11.5–15.5)
WBC: 8.7 10*3/uL (ref 4.0–10.5)

## 2019-06-04 NOTE — Assessment & Plan Note (Signed)
Was in PT/OT and would like to try a new group. Still short ~7% of extension.
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
## 2019-06-04 NOTE — Assessment & Plan Note (Signed)
Doing well overall, but still with some balance issues and mental fatigue. Would like to see PT again to see if she can get additional improvement. On prozac and in therapy but no depressive symptoms currently.

## 2019-06-04 NOTE — Patient Instructions (Addendum)
It was great to meet you!  We will get labs to check for anemia - will send results in MyChart  I recommend that you talk with the OB/GYN about the tiredness during your periods to see if they have thoughts  Shots today

## 2019-06-04 NOTE — Assessment & Plan Note (Signed)
Has had significant improvement with PT. Would like to return to PT to see if she can get completely back to baseline

## 2019-06-04 NOTE — Assessment & Plan Note (Signed)
Getting some worsening fatigue/tiredness with menses. Will check CBC today. Already follows with GYN - advised discussing if they feel birth control could be helpful

## 2019-06-05 ENCOUNTER — Encounter: Payer: Self-pay | Admitting: Family Medicine

## 2019-06-05 ENCOUNTER — Ambulatory Visit: Payer: 59

## 2019-06-05 ENCOUNTER — Encounter: Payer: 59 | Admitting: Occupational Therapy

## 2019-06-09 ENCOUNTER — Encounter: Payer: Self-pay | Admitting: Family Medicine

## 2019-06-10 ENCOUNTER — Encounter: Payer: 59 | Admitting: Occupational Therapy

## 2019-06-10 ENCOUNTER — Ambulatory Visit: Payer: 59

## 2019-06-18 ENCOUNTER — Ambulatory Visit (INDEPENDENT_AMBULATORY_CARE_PROVIDER_SITE_OTHER): Payer: 59 | Admitting: Psychology

## 2019-06-18 DIAGNOSIS — F4310 Post-traumatic stress disorder, unspecified: Secondary | ICD-10-CM

## 2019-06-19 ENCOUNTER — Ambulatory Visit: Payer: Managed Care, Other (non HMO) | Attending: Family Medicine

## 2019-06-19 ENCOUNTER — Other Ambulatory Visit: Payer: Self-pay

## 2019-06-19 DIAGNOSIS — M6281 Muscle weakness (generalized): Secondary | ICD-10-CM | POA: Diagnosis present

## 2019-06-19 DIAGNOSIS — R2681 Unsteadiness on feet: Secondary | ICD-10-CM | POA: Insufficient documentation

## 2019-06-19 DIAGNOSIS — R2689 Other abnormalities of gait and mobility: Secondary | ICD-10-CM | POA: Insufficient documentation

## 2019-06-19 DIAGNOSIS — R278 Other lack of coordination: Secondary | ICD-10-CM | POA: Diagnosis present
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
## 2019-06-19 NOTE — Addendum Note (Signed)
Addended by: Elza Rafter on: 06/19/2019 04:58 PM   Modules accepted: Orders

## 2019-06-25 ENCOUNTER — Ambulatory Visit: Payer: 59 | Admitting: Psychology

## 2019-06-25 ENCOUNTER — Encounter: Payer: Self-pay | Admitting: Family Medicine

## 2019-07-02 ENCOUNTER — Ambulatory Visit (INDEPENDENT_AMBULATORY_CARE_PROVIDER_SITE_OTHER): Payer: 59 | Admitting: Psychology

## 2019-07-02 DIAGNOSIS — F4310 Post-traumatic stress disorder, unspecified: Secondary | ICD-10-CM

## 2019-07-09 ENCOUNTER — Ambulatory Visit (INDEPENDENT_AMBULATORY_CARE_PROVIDER_SITE_OTHER): Payer: 59 | Admitting: Psychology

## 2019-07-09 DIAGNOSIS — F4310 Post-traumatic stress disorder, unspecified: Secondary | ICD-10-CM | POA: Diagnosis not present

## 2019-07-14 ENCOUNTER — Ambulatory Visit: Payer: Managed Care, Other (non HMO) | Admitting: Rehabilitation

## 2019-07-14 ENCOUNTER — Other Ambulatory Visit: Payer: Self-pay

## 2019-07-14 ENCOUNTER — Encounter: Payer: Self-pay | Admitting: Rehabilitation

## 2019-07-14 DIAGNOSIS — R2681 Unsteadiness on feet: Secondary | ICD-10-CM

## 2019-07-14 DIAGNOSIS — R2689 Other abnormalities of gait and mobility: Secondary | ICD-10-CM | POA: Diagnosis not present

## 2019-07-14 DIAGNOSIS — R278 Other lack of coordination: Secondary | ICD-10-CM

## 2019-07-14 DIAGNOSIS — M6281 Muscle weakness (generalized): Secondary | ICD-10-CM

## 2019-07-14 NOTE — Patient Instructions (Signed)
Access Code: [email or web address]  URL: [email or web address]  Date: 07/14/2019  Prepared by: Cameron Sprang   Exercises Half Tandem Stance Balance with Eyes Closed - 3 reps - 30 seconds x 3 with each leg forward hold - 2x daily - 7x weekly Half Tandem Stance Balance with Head Rotation - 3 reps - 30 seconds x 3 with each leg forward hold - 2x daily - 7x weekly Tandem Stance - 3 reps - 30 seconds x 3 with each leg forward hold - 2x daily - 7x weekly Single Leg Balance on Foam Pad - 3 reps - 20s x 3 on each leg hold - 2x daily - 7x weekly Single Leg Balance with Eyes Closed - 3 reps - 15s x 3 on each leg hold - 2x daily - 7x weekly
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
## 2019-07-16 ENCOUNTER — Ambulatory Visit (INDEPENDENT_AMBULATORY_CARE_PROVIDER_SITE_OTHER): Payer: 59 | Admitting: Psychology

## 2019-07-16 ENCOUNTER — Encounter: Payer: Self-pay | Admitting: Family Medicine

## 2019-07-16 DIAGNOSIS — S49102S Unspecified physeal fracture of lower end of humerus, left arm, sequela: Secondary | ICD-10-CM

## 2019-07-16 DIAGNOSIS — F4310 Post-traumatic stress disorder, unspecified: Secondary | ICD-10-CM

## 2019-07-16 NOTE — Telephone Encounter (Signed)
[facility] notes in Care everywhere.

## 2019-07-22 ENCOUNTER — Encounter: Payer: Self-pay | Admitting: Physical Therapy

## 2019-07-22 ENCOUNTER — Ambulatory Visit: Payer: Managed Care, Other (non HMO) | Attending: Family Medicine | Admitting: Physical Therapy

## 2019-07-22 ENCOUNTER — Other Ambulatory Visit: Payer: Self-pay

## 2019-07-22 DIAGNOSIS — M6281 Muscle weakness (generalized): Secondary | ICD-10-CM | POA: Insufficient documentation

## 2019-07-22 DIAGNOSIS — R278 Other lack of coordination: Secondary | ICD-10-CM | POA: Insufficient documentation

## 2019-07-22 DIAGNOSIS — M25622 Stiffness of left elbow, not elsewhere classified: Secondary | ICD-10-CM | POA: Insufficient documentation

## 2019-07-22 DIAGNOSIS — R2681 Unsteadiness on feet: Secondary | ICD-10-CM | POA: Diagnosis present

## 2019-07-22 DIAGNOSIS — R2689 Other abnormalities of gait and mobility: Secondary | ICD-10-CM | POA: Insufficient documentation

## 2019-07-23 ENCOUNTER — Ambulatory Visit (INDEPENDENT_AMBULATORY_CARE_PROVIDER_SITE_OTHER): Payer: 59 | Admitting: Psychology

## 2019-07-23 DIAGNOSIS — F4310 Post-traumatic stress disorder, unspecified: Secondary | ICD-10-CM
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
## 2019-07-23 NOTE — Patient Instructions (Signed)
Balance on foam - 4 positions - EO and EC - feet apart and feet together

## 2019-07-24 ENCOUNTER — Ambulatory Visit: Payer: Managed Care, Other (non HMO) | Admitting: Physical Therapy

## 2019-07-29 ENCOUNTER — Other Ambulatory Visit: Payer: Self-pay

## 2019-07-29 ENCOUNTER — Ambulatory Visit: Payer: Managed Care, Other (non HMO) | Admitting: Physical Therapy

## 2019-07-29 DIAGNOSIS — R278 Other lack of coordination: Secondary | ICD-10-CM

## 2019-07-29 DIAGNOSIS — R2689 Other abnormalities of gait and mobility: Secondary | ICD-10-CM

## 2019-07-29 DIAGNOSIS — M6281 Muscle weakness (generalized): Secondary | ICD-10-CM

## 2019-07-30 ENCOUNTER — Encounter: Payer: Self-pay | Admitting: Physical Therapy

## 2019-07-30 ENCOUNTER — Ambulatory Visit (INDEPENDENT_AMBULATORY_CARE_PROVIDER_SITE_OTHER): Payer: 59 | Admitting: Psychology

## 2019-07-30 DIAGNOSIS — F4310 Post-traumatic stress disorder, unspecified: Secondary | ICD-10-CM
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
## 2019-07-31 ENCOUNTER — Ambulatory Visit: Payer: Managed Care, Other (non HMO) | Admitting: Physical Therapy

## 2019-07-31 ENCOUNTER — Ambulatory Visit: Payer: Managed Care, Other (non HMO) | Admitting: Occupational Therapy

## 2019-08-05 ENCOUNTER — Ambulatory Visit: Payer: Managed Care, Other (non HMO) | Admitting: Physical Therapy

## 2019-08-05 ENCOUNTER — Ambulatory Visit: Payer: Managed Care, Other (non HMO) | Admitting: Occupational Therapy

## 2019-08-05 ENCOUNTER — Other Ambulatory Visit: Payer: Self-pay

## 2019-08-05 DIAGNOSIS — R278 Other lack of coordination: Secondary | ICD-10-CM

## 2019-08-05 DIAGNOSIS — R2689 Other abnormalities of gait and mobility: Secondary | ICD-10-CM | POA: Diagnosis not present

## 2019-08-05 DIAGNOSIS — M25622 Stiffness of left elbow, not elsewhere classified: Secondary | ICD-10-CM

## 2019-08-05 DIAGNOSIS — M6281 Muscle weakness (generalized): Secondary | ICD-10-CM

## 2019-08-05 DIAGNOSIS — R2681 Unsteadiness on feet: Secondary | ICD-10-CM

## 2019-08-05 NOTE — Patient Instructions (Signed)
°  Aquatic Therapy: What to Expect! ° °Where:  °[facility] °[address] °[place], [place]  [address] °[phone] ° °NOTE:  You will receive an automated phone message reminding you of your appointment and it will say the appointment is at the Rehab Center on [address]  We are working to fix this- just know that you will meet us at the pool! ° °How to Prepare: °• Please make sure you drink 8 ounces of water about one hour prior to your pool session °• A caregiver MUST attend the entire session with the patient.  The caregiver will be responsible for assisting with dressing as well as any toileting needs.  If the patient will be doing a home program this should likely be the person who will assist as well.  °• Patients must wear either their street shoes or pool shoes until they are ready to enter the pool with the therapist.  Patients must also wear either street shoes or pool shoes once exiting the pool to walk to the locker room.  This will helps us prevent slips and falls.  °• Please arrive 15 minutes early to prepare for your pool therapy session °• Sign in at the front desk on the clipboard marked for [place] °• You may use the locker rooms on your right and then enter directly into the recreation pool (NOT the competition pool) °• Please make sure to attend to any toileting needs prior to entering the pool °• Please be dressed in your swim suit and on the pool deck at least 5 minutes before your appointment °• Once on the pool deck your therapist will ask you to sign the Patient  Consent and Assignment of Benefits form °• Your therapist may take your blood pressure prior to, during and after your session if indicated ° °About the pool  and parking: °1. Entering the pool °Your therapist will assist you; there are multiple ways to enter including stairs with railings, a walk in ramp, a roll in chair and a mechanical lift. Your therapist will determine the most appropriate way for  you. °2. Water temperature is usually between 86-87 degrees °3. There may be other swimmers in the pool at the same time °4. Parking is free: if you arrive and there is a parking attendant please inform them you are there for therapy and do not pay to park. Handicapped parking is located at the front entrance. ° °Contact Info:     Appointments: °[facility]  All sessions are 45 minutes   °[address]  [address]     Please call the [facility] if   °[place], [place]   [address]    you need to cancel or reschedule an appointment.  °[phone]     ° ° ° ° °
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
## 2019-08-06 ENCOUNTER — Ambulatory Visit (INDEPENDENT_AMBULATORY_CARE_PROVIDER_SITE_OTHER): Payer: 59 | Admitting: Psychology

## 2019-08-06 ENCOUNTER — Encounter: Payer: Self-pay | Admitting: Physical Therapy

## 2019-08-06 DIAGNOSIS — F4310 Post-traumatic stress disorder, unspecified: Secondary | ICD-10-CM | POA: Diagnosis not present
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
## 2019-08-07 ENCOUNTER — Ambulatory Visit: Payer: Managed Care, Other (non HMO) | Admitting: Physical Therapy

## 2019-08-11 ENCOUNTER — Ambulatory Visit: Payer: Managed Care, Other (non HMO) | Admitting: Physical Therapy

## 2019-08-11 ENCOUNTER — Other Ambulatory Visit: Payer: Self-pay

## 2019-08-11 DIAGNOSIS — R2689 Other abnormalities of gait and mobility: Secondary | ICD-10-CM | POA: Diagnosis not present

## 2019-08-11 DIAGNOSIS — R278 Other lack of coordination: Secondary | ICD-10-CM

## 2019-08-11 DIAGNOSIS — R2681 Unsteadiness on feet: Secondary | ICD-10-CM

## 2019-08-12 ENCOUNTER — Ambulatory Visit: Payer: Managed Care, Other (non HMO) | Admitting: Physical Therapy

## 2019-08-12 ENCOUNTER — Encounter: Payer: Self-pay | Admitting: Physical Therapy
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
## 2019-08-13 ENCOUNTER — Ambulatory Visit (INDEPENDENT_AMBULATORY_CARE_PROVIDER_SITE_OTHER): Payer: 59 | Admitting: Psychology

## 2019-08-13 DIAGNOSIS — F411 Generalized anxiety disorder: Secondary | ICD-10-CM | POA: Diagnosis not present

## 2019-08-14 ENCOUNTER — Ambulatory Visit: Payer: Managed Care, Other (non HMO) | Admitting: Physical Therapy

## 2019-08-18 ENCOUNTER — Ambulatory Visit: Payer: Managed Care, Other (non HMO) | Attending: Family Medicine | Admitting: Rehabilitation

## 2019-08-18 ENCOUNTER — Other Ambulatory Visit: Payer: Self-pay

## 2019-08-18 ENCOUNTER — Encounter: Payer: Self-pay | Admitting: Rehabilitation

## 2019-08-18 ENCOUNTER — Ambulatory Visit: Payer: Managed Care, Other (non HMO) | Admitting: Physical Therapy

## 2019-08-18 DIAGNOSIS — M25622 Stiffness of left elbow, not elsewhere classified: Secondary | ICD-10-CM | POA: Diagnosis present

## 2019-08-18 DIAGNOSIS — R278 Other lack of coordination: Secondary | ICD-10-CM | POA: Insufficient documentation

## 2019-08-18 DIAGNOSIS — R2681 Unsteadiness on feet: Secondary | ICD-10-CM | POA: Diagnosis present

## 2019-08-18 DIAGNOSIS — R2689 Other abnormalities of gait and mobility: Secondary | ICD-10-CM | POA: Diagnosis present

## 2019-08-18 DIAGNOSIS — M6281 Muscle weakness (generalized): Secondary | ICD-10-CM | POA: Diagnosis present
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
## 2019-08-18 NOTE — Patient Instructions (Signed)
Access Code: [email or web address] URL: [email or web address] Date: 08/18/2019 Prepared by: Cameron Sprang  Exercises Squat on Flat Side of BOSU - 10 reps - 1 sets - 1x daily - 7x weekly Single Leg Balance on BOSU Ball - 10 reps - 1 sets - 1x daily - 7x weekly Standing Hip Abduction on BOSU Ball - 10 reps - 1 sets - 1x daily - 7x weekly Single Leg Balance with Eyes Closed - 3 reps - 15s x 3 on each leg hold - 2x daily - 7x weekly Side Stepping with Resistance at Ankles - 10 reps - 3 sets - 1x daily - 7x weekly

## 2019-08-19 ENCOUNTER — Ambulatory Visit: Payer: Managed Care, Other (non HMO) | Admitting: Occupational Therapy

## 2019-08-19 DIAGNOSIS — R278 Other lack of coordination: Secondary | ICD-10-CM | POA: Diagnosis not present

## 2019-08-19 DIAGNOSIS — M25622 Stiffness of left elbow, not elsewhere classified: Secondary | ICD-10-CM

## 2019-08-19 DIAGNOSIS — M6281 Muscle weakness (generalized): Secondary | ICD-10-CM

## 2019-08-19 NOTE — Patient Instructions (Signed)
   Strengthening: Resisted Flexion   Hold tubing with _Lt____ arm(s) at side. Pull forward and up. Move shoulder through pain-free range of motion. Repeat __10__ times per set.  Do _1-2_ sessions per day , every other day. (USE RED BAND)   Scapular Retraction: Bilateral    Facing anchor, pull arms back, bringing shoulder blades together. Repeat _10___ times per set. Do __1__ sets per session. Do ___2_ sessions per day. (USE GREEN BAND)     Strengthening: Resisted Extension   Hold tubing in __BOTH___ hand(s), arm forward. Pull arm back, elbow straight. Repeat _10___ times per set. Do _1-2___ sessions per day, every other day. (USE RED BAND)    Resisted Horizontal Abduction: Bilateral   Sit or stand, tubing in both hands, arms out in front. Keeping arms straight, pinch shoulder blades together and stretch arms out. Repeat _10___ times per set. Do _1-2___ sessions per day, every other day. (USE RED BAND)    Elbow Flexion: Resisted   With tubing held in ___Lt___ hand(s) and other end secured under foot, curl arm up as far as possible. Repeat _10___ times per set. Do _1-2___ sessions per day, every other day. (USE GREEN BAND)     Elbow Extension: Resisted   Sit in chair with resistive band anchored at opposite shoulder and ___Lt____ elbow bent. Straighten Lt elbow. Repeat _10___ times per set.  Do _1-2___ sessions per day, every other day. (USE GREEN BAND)    Triceps Activities: Push-Ups    Wheelchair in locked position, child pushes down on armrests to lift bottom off seat. Lean on wall with straightened arms. Bend arms and touch nose to wall. Do not move feet.Stomach should not touch wall.Push away by straightening arms. Keep trunk straight. Do not let fingers point inward.
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
## 2019-08-20 ENCOUNTER — Ambulatory Visit (INDEPENDENT_AMBULATORY_CARE_PROVIDER_SITE_OTHER): Payer: 59 | Admitting: Psychology

## 2019-08-20 ENCOUNTER — Ambulatory Visit: Payer: 59 | Admitting: Psychology

## 2019-08-20 DIAGNOSIS — F4310 Post-traumatic stress disorder, unspecified: Secondary | ICD-10-CM | POA: Diagnosis not present

## 2019-08-25 ENCOUNTER — Ambulatory Visit: Payer: Managed Care, Other (non HMO) | Admitting: Physical Therapy

## 2019-08-26 ENCOUNTER — Encounter: Payer: Self-pay | Admitting: Family Medicine

## 2019-08-27 ENCOUNTER — Ambulatory Visit (INDEPENDENT_AMBULATORY_CARE_PROVIDER_SITE_OTHER): Payer: 59 | Admitting: Psychology

## 2019-08-27 DIAGNOSIS — F4310 Post-traumatic stress disorder, unspecified: Secondary | ICD-10-CM | POA: Diagnosis not present

## 2019-08-28 ENCOUNTER — Other Ambulatory Visit: Payer: Self-pay

## 2019-08-28 ENCOUNTER — Ambulatory Visit: Payer: Managed Care, Other (non HMO) | Admitting: Occupational Therapy

## 2019-08-28 DIAGNOSIS — M6281 Muscle weakness (generalized): Secondary | ICD-10-CM

## 2019-08-28 DIAGNOSIS — M25622 Stiffness of left elbow, not elsewhere classified: Secondary | ICD-10-CM

## 2019-08-28 DIAGNOSIS — R278 Other lack of coordination: Secondary | ICD-10-CM | POA: Diagnosis not present
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
## 2019-09-01 ENCOUNTER — Ambulatory Visit: Payer: Managed Care, Other (non HMO) | Admitting: Family Medicine

## 2019-09-03 ENCOUNTER — Ambulatory Visit: Payer: 59 | Admitting: Psychology

## 2019-09-10 ENCOUNTER — Ambulatory Visit (INDEPENDENT_AMBULATORY_CARE_PROVIDER_SITE_OTHER): Payer: 59 | Admitting: Psychology

## 2019-09-10 DIAGNOSIS — F4310 Post-traumatic stress disorder, unspecified: Secondary | ICD-10-CM

## 2019-09-17 ENCOUNTER — Ambulatory Visit (INDEPENDENT_AMBULATORY_CARE_PROVIDER_SITE_OTHER): Payer: 59 | Admitting: Psychology

## 2019-09-17 DIAGNOSIS — F4310 Post-traumatic stress disorder, unspecified: Secondary | ICD-10-CM | POA: Diagnosis not present

## 2019-09-24 ENCOUNTER — Ambulatory Visit (INDEPENDENT_AMBULATORY_CARE_PROVIDER_SITE_OTHER): Payer: 59 | Admitting: Psychology

## 2019-09-24 DIAGNOSIS — F4310 Post-traumatic stress disorder, unspecified: Secondary | ICD-10-CM

## 2019-09-25 ENCOUNTER — Encounter: Payer: Self-pay | Admitting: Family Medicine

## 2019-10-01 ENCOUNTER — Ambulatory Visit (INDEPENDENT_AMBULATORY_CARE_PROVIDER_SITE_OTHER): Payer: 59 | Admitting: Psychology

## 2019-10-01 ENCOUNTER — Encounter: Payer: Self-pay | Admitting: Family Medicine

## 2019-10-01 DIAGNOSIS — F4310 Post-traumatic stress disorder, unspecified: Secondary | ICD-10-CM

## 2019-10-01 NOTE — Telephone Encounter (Signed)
Patient already made an appointment to discuss

## 2019-10-06 ENCOUNTER — Encounter: Payer: Self-pay | Admitting: Family Medicine

## 2019-10-06 ENCOUNTER — Ambulatory Visit (INDEPENDENT_AMBULATORY_CARE_PROVIDER_SITE_OTHER): Payer: Managed Care, Other (non HMO) | Admitting: Family Medicine

## 2019-10-06 ENCOUNTER — Other Ambulatory Visit: Payer: Self-pay

## 2019-10-06 VITALS — BP 104/72 | HR 73 | Temp 98.1°F | Ht 68.0 in | Wt 220.2 lb

## 2019-10-06 DIAGNOSIS — N83209 Unspecified ovarian cyst, unspecified side: Secondary | ICD-10-CM

## 2019-10-06 DIAGNOSIS — S069X9S Unspecified intracranial injury with loss of consciousness of unspecified duration, sequela: Secondary | ICD-10-CM | POA: Diagnosis not present

## 2019-10-06 DIAGNOSIS — N926 Irregular menstruation, unspecified: Secondary | ICD-10-CM

## 2019-10-06 DIAGNOSIS — N83201 Unspecified ovarian cyst, right side: Secondary | ICD-10-CM | POA: Diagnosis not present

## 2019-10-06 HISTORY — DX: Irregular menstruation, unspecified: N92.6

## 2019-10-06 HISTORY — DX: Unspecified ovarian cyst, unspecified side: N83.209

## 2019-10-06 NOTE — Assessment & Plan Note (Addendum)
Noted on imaging and persistent. Endocrinology referral given irregular periods and concern about TBI symptoms worsening during cycle and possible association with cysts. Overdue for repeat imaging, MyChart message to patient as noted after visit.

## 2019-10-06 NOTE — Patient Instructions (Signed)
Endocrinology referral to discuss cysts and symptoms.   Consider possible trial of Birth Control pills in the future to see if that helps (though, there is little risk to irregular periods until you are trying to get pregnant).    Consider trying Vitamin E 400 units daily for 5 days around menses.  -- may be hard to time since irregular -- I would recommend trying with 2 cycles and if no improvement, would not continue

## 2019-10-06 NOTE — Assessment & Plan Note (Signed)
Given TBI worsening during cycle and presence of cyst, endocrinology referral request to consider correlation and see if any intervention would help symptoms. Discussed that if negative work-up a trial of OCPs may be helpful if not desiring pregnancy.

## 2019-10-06 NOTE — Assessment & Plan Note (Signed)
Brain fog symptoms are worse during cycle. Discussed trial of Vit E which can help with migraines associate with menses and likely low harm. She will consider.

## 2019-10-06 NOTE — Progress Notes (Signed)
Subjective:     Monica Forbes is a 24 y.o. female presenting for Discuss menstrual cycle (irregular. nexplanon gave patient severe menstrual cycle symptoms. )     HPI   # Irregular periods - nexplanon w/o improvement - TBI symptoms worse with menstrual cycles --- mental fatigue, excessive sleep - neurologist and GYN are advising her to take note - has not tried OCPs or IUD (told no IUD to the cysts) - worried about the OCPs impacting her brain injury progression - cycles - 30-50 days, duration 4-5 days, heavy day - 3 - using 3-4 pads on this day - nexplanon removed on December 2020 - prior to nexplanon had irregular periods - did not have periods while hospitalized   Review of Systems   Social History   Tobacco Use  Smoking Status Never Smoker  Smokeless Tobacco Never Used        Objective:    BP Readings from Last 3 Encounters:  10/06/19 104/72  06/04/19 104/62  07/24/18 115/76   Wt Readings from Last 3 Encounters:  10/06/19 220 lb 4 oz (99.9 kg)  06/04/19 222 lb 4 oz (100.8 kg)    BP 104/72   Pulse 73   Temp 98.1 F (36.7 C)   Ht 5\' 8"  (1.727 m)   Wt 220 lb 4 oz (99.9 kg)   LMP 09/27/2019   SpO2 98%   BMI 33.49 kg/m    Physical Exam Constitutional:      General: She is not in acute distress.    Appearance: She is well-developed. She is not diaphoretic.  HENT:     Right Ear: External ear normal.     Left Ear: External ear normal.     Nose: Nose normal.  Eyes:     Conjunctiva/sclera: Conjunctivae normal.  Cardiovascular:     Rate and Rhythm: Normal rate.  Pulmonary:     Effort: Pulmonary effort is normal.  Musculoskeletal:     Cervical back: Neck supple.  Skin:    General: Skin is warm and dry.     Capillary Refill: Capillary refill takes less than 2 seconds.  Neurological:     Mental Status: She is alert. Mental status is at baseline.  Psychiatric:        Mood and Affect: Mood normal.        Behavior: Behavior normal.      06/07/2018: US pelvic - 2.9 cm complex cystic structure in right adnexa - complex paraovarian or paratubal cyst or endometrioma. Recommend 1 year follow-up      Assessment & Plan:   Problem List Items Addressed This Visit      Endocrine   Ovarian cyst    Noted on imaging and persistent. Endocrinology referral given irregular periods and concern about TBI symptoms worsening during cycle and possible association with cysts. Overdue for repeat imaging, MyChart message to patient as noted after visit.         Nervous and Auditory   TBI (traumatic brain injury) ([place])    Brain fog symptoms are worse during cycle. Discussed trial of Vit E which can help with migraines associate with menses and likely low harm. She will consider.       Relevant Orders   Ambulatory referral to Endocrinology     Other   Irregular menstrual cycle - Primary    Given TBI worsening during cycle and presence of cyst, endocrinology referral request to consider correlation and see if any intervention would help symptoms. Discussed that  if negative work-up a trial of OCPs may be helpful if not desiring pregnancy.       Relevant Orders   Ambulatory referral to Endocrinology       Return if symptoms worsen or fail to improve.  Lesleigh Noe, MD

## 2019-10-08 ENCOUNTER — Encounter: Payer: Self-pay | Admitting: Family Medicine

## 2019-10-08 ENCOUNTER — Ambulatory Visit (INDEPENDENT_AMBULATORY_CARE_PROVIDER_SITE_OTHER): Payer: 59 | Admitting: Psychology

## 2019-10-08 DIAGNOSIS — F4310 Post-traumatic stress disorder, unspecified: Secondary | ICD-10-CM | POA: Diagnosis not present

## 2019-10-15 ENCOUNTER — Ambulatory Visit (INDEPENDENT_AMBULATORY_CARE_PROVIDER_SITE_OTHER): Payer: 59 | Admitting: Psychology

## 2019-10-15 DIAGNOSIS — F4310 Post-traumatic stress disorder, unspecified: Secondary | ICD-10-CM | POA: Diagnosis not present

## 2019-10-22 ENCOUNTER — Encounter: Payer: Self-pay | Admitting: Family Medicine

## 2019-10-22 ENCOUNTER — Ambulatory Visit (INDEPENDENT_AMBULATORY_CARE_PROVIDER_SITE_OTHER): Payer: 59 | Admitting: Psychology

## 2019-10-22 DIAGNOSIS — F4310 Post-traumatic stress disorder, unspecified: Secondary | ICD-10-CM | POA: Diagnosis not present

## 2019-10-22 DIAGNOSIS — S069X9S Unspecified intracranial injury with loss of consciousness of unspecified duration, sequela: Secondary | ICD-10-CM

## 2019-10-22 MED ORDER — FLUOXETINE HCL 40 MG PO CAPS: 40.0000 mg | ORAL_CAPSULE | Freq: Every day | ORAL | 3 refills | Status: DC

## 2019-10-28 ENCOUNTER — Encounter: Payer: Self-pay | Admitting: Family Medicine

## 2019-10-29 ENCOUNTER — Ambulatory Visit (INDEPENDENT_AMBULATORY_CARE_PROVIDER_SITE_OTHER): Payer: 59 | Admitting: Psychology

## 2019-10-29 DIAGNOSIS — F4310 Post-traumatic stress disorder, unspecified: Secondary | ICD-10-CM | POA: Diagnosis not present

## 2019-11-05 ENCOUNTER — Ambulatory Visit (INDEPENDENT_AMBULATORY_CARE_PROVIDER_SITE_OTHER): Payer: 59 | Admitting: Psychology

## 2019-11-05 DIAGNOSIS — F4310 Post-traumatic stress disorder, unspecified: Secondary | ICD-10-CM

## 2019-11-06 ENCOUNTER — Other Ambulatory Visit: Payer: Self-pay

## 2019-11-06 ENCOUNTER — Ambulatory Visit (INDEPENDENT_AMBULATORY_CARE_PROVIDER_SITE_OTHER): Payer: Managed Care, Other (non HMO) | Admitting: Family Medicine

## 2019-11-06 ENCOUNTER — Encounter: Payer: Self-pay | Admitting: Family Medicine

## 2019-11-06 DIAGNOSIS — K625 Hemorrhage of anus and rectum: Secondary | ICD-10-CM | POA: Diagnosis not present

## 2019-11-06 NOTE — Progress Notes (Signed)
Subjective:    Patient ID: Monica Forbes, female    DOB: 1995-09-23, 24 y.o.   MRN: [id number]  This visit occurred during the SARS-CoV-2 public health emergency.  Safety protocols were in place, including screening questions prior to the visit, additional usage of staff PPE, and extensive cleaning of exam room while observing appropriate contact time as indicated for disinfecting solutions.    HPI 24 yo pt of Dr Einar Pheasant presents with c/o blood in stool   Wt Readings from Last 3 Encounters:  11/06/19 217 lb 7 oz (98.6 kg)  10/06/19 220 lb 4 oz (99.9 kg)  06/04/19 222 lb 4 oz (100.8 kg)   33.06 kg/m   Yesterday she had some incontinence (urinary)  Followed up with her - [facility] - had her drop off urine specimen  UA looked positive- waiting to hear from them No dysuria or other symptoms   This am she had a bowel movement  Normal bm   (no pain)  When she wiped- saw blood from the anal area  Bright red - fair amount of blood  Just a little in stool  Not hard /not straining   No abdominal pain    No h/o hemorrhoids that she knows of   Has never had a baby   Not on birth control  Is sexually active  Using condoms   Periods are fairly regular /does not skip  LMP 4/14    Has had constipation in the past (2019) and had enemas    Had the [employer] vaccine for covid-did well    Past h/o traumatic brain injury  Has more symptoms around her period   Patient Active Problem List   Diagnosis Date Noted  . Rectal bleeding 11/06/2019  . Ovarian cyst 10/06/2019  . Irregular menstrual cycle 10/06/2019  . Menorrhagia with regular cycle 06/04/2019  . 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 Humerus fracture 02/06/2018  . TBI (traumatic brain injury) ([place]) 01/12/2018   Past Medical History:  Diagnosis Date  . [facility] (traumatic [facility]) [facility])    Past Surgical History:  Procedure Laterality Date  . External Fixator in Left arm  2019  . HIP ARTHROSCOPY Right  2020   at [facility]  . SPINAL FUSION  2019  . TONSILLECTOMY     Social History   Tobacco Use  . Smoking status: Never Smoker  . Smokeless tobacco: Never Used  Substance Use Topics  . Alcohol use: Not Currently    Comment: a couple times a year  . Drug use: Never   Family History  Problem Relation Age of Onset  . Hyperlipidemia Father   . Hypertension Father   . Gout Father   . Gout Paternal Grandmother   . Hyperlipidemia Paternal Grandfather   . Heart disease Paternal Grandfather        triple bypass   Allergies  Allergen Reactions  . Pineapple Rash   Current Outpatient Medications on File Prior to Visit  Medication Sig Dispense Refill  . FLUoxetine (PROZAC) 40 MG capsule Take 1 capsule (40 mg total) by mouth daily. 90 capsule 3   No current facility-administered medications on file prior to visit.     Review of Systems  Constitutional: Negative for activity change, appetite change, fatigue, fever and unexpected weight change.  HENT: Negative for congestion, ear pain, rhinorrhea, sinus pressure and sore throat.   Eyes: Negative for pain, redness and visual disturbance.  Respiratory: Negative for  cough, shortness of breath and wheezing.   Cardiovascular: Negative for chest pain and palpitations.  Gastrointestinal: Positive for anal bleeding. Negative for abdominal distention, abdominal pain, blood in stool, constipation, diarrhea, nausea, rectal pain and vomiting.  Endocrine: Negative for polydipsia and polyuria.  Genitourinary: Negative for dysuria, frequency and urgency.  Musculoskeletal: Negative for arthralgias, back pain and myalgias.  Skin: Negative for pallor and rash.  Allergic/Immunologic: Negative for environmental allergies.  Neurological: Negative for dizziness, syncope and headaches.  Hematological: Negative for adenopathy. Does not bruise/bleed easily.  Psychiatric/Behavioral: Negative for decreased concentration and dysphoric mood. The patient is not  nervous/anxious.        Objective:   Physical Exam Constitutional:      General: She is not in acute distress.    Appearance: Normal appearance. She is well-developed. She is obese. She is not ill-appearing.  HENT:     Head: Normocephalic and atraumatic.  Eyes:     General: No scleral icterus.    Conjunctiva/sclera: Conjunctivae normal.     Pupils: Pupils are equal, round, and reactive to light.  Cardiovascular:     Rate and Rhythm: Normal rate and regular rhythm.     Heart sounds: Normal heart sounds.  Pulmonary:     Effort: Pulmonary effort is normal. No respiratory distress.     Breath sounds: Normal breath sounds. No wheezing or rales.  Abdominal:     General: Bowel sounds are normal. There is no distension.     Palpations: Abdomen is soft. There is no mass.     Tenderness: There is no abdominal tenderness. There is no guarding or rebound.  Genitourinary:    Rectum: Guaiac result negative. Anal fissure present. No mass, tenderness, external hemorrhoid or internal hemorrhoid. Normal anal tone.     Comments: Very small anal fissure (not actively bleeding) at 6:00 position seen on anoscopy  No tenderness or mass  No skin changes  Musculoskeletal:     Cervical back: Normal range of motion and neck supple.  Lymphadenopathy:     Cervical: No cervical adenopathy.  Skin:    General: Skin is warm and dry.     Coloration: Skin is not pale.     Findings: No erythema.  Neurological:     Mental Status: She is alert.           Assessment & Plan:   Problem List Items Addressed This Visit      Digestive   Rectal bleeding    Tiny anal fissure seen on anoscopy, not currently bleeding (stool heme neg)  Recommend keeping stools soft /avoid constipation  If pain or bleeding returns or persists -inst to call  Also if abdominal pain or other new symptoms

## 2019-11-06 NOTE — Assessment & Plan Note (Signed)
Tiny anal fissure seen on anoscopy, not currently bleeding (stool heme neg)  Recommend keeping stools soft /avoid constipation  If pain or bleeding returns or persists -inst to call  Also if abdominal pain or other new symptoms

## 2019-11-06 NOTE — Patient Instructions (Addendum)
It does look like your urine sample yesterday may be indicative of infection - unsure if your doctor will treat or wait for a culture result   Call that office about your urine result   Keep stools soft / don't strain  If you feel constipated-take a stool softener over the counter   I think you have a very tiny rectal fissure that bled Is not bleeding now  It may happen again - but if worse or keeps re occurring let us know   Drink lots of fluids /eat fruits and veggies

## 2019-11-09 MED ORDER — CEPHALEXIN 500 MG PO CAPS: 500.0000 mg | ORAL_CAPSULE | Freq: Two times a day (BID) | ORAL | 0 refills | Status: DC

## 2019-11-12 ENCOUNTER — Ambulatory Visit (INDEPENDENT_AMBULATORY_CARE_PROVIDER_SITE_OTHER): Payer: 59 | Admitting: Psychology

## 2019-11-12 DIAGNOSIS — F4310 Post-traumatic stress disorder, unspecified: Secondary | ICD-10-CM

## 2019-11-19 ENCOUNTER — Other Ambulatory Visit: Payer: Self-pay

## 2019-11-19 ENCOUNTER — Ambulatory Visit (INDEPENDENT_AMBULATORY_CARE_PROVIDER_SITE_OTHER): Payer: 59 | Admitting: Psychology

## 2019-11-19 DIAGNOSIS — F4310 Post-traumatic stress disorder, unspecified: Secondary | ICD-10-CM | POA: Diagnosis not present

## 2019-11-21 ENCOUNTER — Other Ambulatory Visit: Payer: Self-pay

## 2019-11-21 ENCOUNTER — Ambulatory Visit (INDEPENDENT_AMBULATORY_CARE_PROVIDER_SITE_OTHER): Payer: Managed Care, Other (non HMO) | Admitting: Internal Medicine

## 2019-11-21 ENCOUNTER — Encounter: Payer: Self-pay | Admitting: Internal Medicine

## 2019-11-21 VITALS — BP 112/64 | HR 73 | Temp 97.8°F | Ht 68.0 in | Wt 219.2 lb

## 2019-11-21 DIAGNOSIS — N926 Irregular menstruation, unspecified: Secondary | ICD-10-CM | POA: Diagnosis not present

## 2019-11-21 DIAGNOSIS — S069X5S Unspecified intracranial injury with loss of consciousness greater than 24 hours with return to pre-existing conscious level, sequela: Secondary | ICD-10-CM

## 2019-11-21 LAB — TSH: TSH: 1.94 u[IU]/mL (ref 0.35–4.50)

## 2019-11-21 LAB — T4, FREE: Free T4: 0.91 ng/dL (ref 0.60–1.60)

## 2019-11-21 LAB — CORTISOL: Cortisol, Plasma: 8.4 ug/dL

## 2019-11-21 LAB — LUTEINIZING HORMONE: LH: 5.23 m[IU]/mL
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
## 2019-11-24 ENCOUNTER — Encounter: Payer: Self-pay | Admitting: Family Medicine

## 2019-11-24 LAB — PROLACTIN: Prolactin: 10.9 ng/mL

## 2019-11-24 LAB — ACTH: C206 ACTH: 40 pg/mL (ref 6–50)

## 2019-11-24 LAB — INSULIN-LIKE GROWTH FACTOR
IGF-I, LC/MS: 173 ng/mL (ref 63–373)
Z-Score (Female): -0.1 SD (ref ?–2.0)

## 2019-11-26 ENCOUNTER — Ambulatory Visit (INDEPENDENT_AMBULATORY_CARE_PROVIDER_SITE_OTHER): Payer: 59 | Admitting: Psychology

## 2019-11-26 DIAGNOSIS — F4310 Post-traumatic stress disorder, unspecified: Secondary | ICD-10-CM | POA: Diagnosis not present

## 2019-11-27 ENCOUNTER — Ambulatory Visit: Payer: Managed Care, Other (non HMO) | Admitting: Family Medicine

## 2019-11-27 ENCOUNTER — Other Ambulatory Visit: Payer: Self-pay

## 2019-11-27 DIAGNOSIS — F410 Panic disorder [episodic paroxysmal anxiety] without agoraphobia: Secondary | ICD-10-CM

## 2019-11-27 DIAGNOSIS — Z87820 Personal history of traumatic brain injury: Secondary | ICD-10-CM

## 2019-11-27 DIAGNOSIS — F411 Generalized anxiety disorder: Secondary | ICD-10-CM

## 2019-11-27 MED ORDER — FLUOXETINE HCL 20 MG PO TABS: 20.0000 mg | ORAL_TABLET | Freq: Every day | ORAL | 0 refills | Status: DC

## 2019-11-27 MED ORDER — HYDROXYZINE HCL 25 MG PO TABS: 25.0000 mg | ORAL_TABLET | Freq: Three times a day (TID) | ORAL | 1 refills | Status: DC | PRN

## 2019-11-27 NOTE — Assessment & Plan Note (Signed)
Doing better. Will stop fluoxetine due to worsening anxiety symptoms and now >1 year since starting.

## 2019-11-27 NOTE — Progress Notes (Signed)
Subjective:     Monica Forbes is a 24 y.o. 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 More panic attacks  Social History   Tobacco Use  Smoking Status Never Smoker  Smokeless Tobacco Never Used        Objective:    BP Readings from Last 3 Encounters:  11/27/19 108/70  11/21/19 112/64  11/06/19 114/62   Wt Readings from Last 3 Encounters:  11/27/19 218 lb (98.9 kg)  11/21/19 219 lb 3.2 oz (99.4 kg)  11/06/19 217 lb 7 oz (98.6 kg)    BP 108/70   Pulse 78   Temp 97.9 F (36.6 C)   Resp 18   Ht 5\' 8"  (1.727 m)   Wt 218 lb (98.9 kg)   LMP 11/26/2019   BMI 33.15 kg/m    Physical Exam Constitutional:      General: She is not in acute distress.    Appearance: She is well-developed. She is not diaphoretic.  HENT:     Right Ear: External ear normal.     Left Ear: External ear normal.     Nose: Nose normal.  Eyes:     Conjunctiva/sclera: Conjunctivae normal.  Cardiovascular:     Rate and Rhythm: Normal rate.  Pulmonary:     Effort: Pulmonary effort is normal.  Musculoskeletal:     Cervical back: Neck supple.  Skin:    General: Skin is warm and dry.     Capillary Refill: Capillary refill takes less than 2 seconds.  Neurological:     Mental Status: She is alert. Mental  status is at baseline.  Psychiatric:        Mood and Affect: Mood normal.        Behavior: Behavior normal.    GAD 7 : Generalized Anxiety Score 11/27/2019  Nervous, Anxious, on Edge 1  Control/stop worrying 1  Worry too much - different things 1  Trouble relaxing 1  Restless 1  Easily annoyed or irritable 1  Afraid - awful might happen 1  Total GAD 7 Score 7  Anxiety Difficulty Somewhat difficult    Depression screen [facility] 2/9 11/27/2019 06/04/2019  Decreased Interest 0 0  Down, Depressed, Hopeless 1 0  PHQ - 2 Score 1 0  Altered sleeping 0 -  Tired, decreased energy 0 -  Change in appetite 0 -  Feeling bad or failure about yourself  0 -  Trouble concentrating 1 -  Moving slowly or fidgety/restless 1 -  Suicidal thoughts 0 -  PHQ-9 Score 3 -  Difficult doing work/chores Not difficult at all -  Some recent data might be hidden         Assessment & Plan:   Problem List Items Addressed This  Visit      Nervous and Auditory   History of traumatic brain injury    Doing better. Will stop fluoxetine due to worsening anxiety symptoms and now >1 year since starting.         Other   Generalized anxiety disorder with panic attacks    Complex hx with SSRI started initially due to post TBI. Denies current depressive symptoms but with worsening symptoms more consistent with anxiety. Following with therapy and has a dog which also helps. Discussed options of switching medications, trial of lower dose of Fluoxetine, or stopping. 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 Will provided hydroxyzine for prn use for severe symptoms discussed SSRI withdrawal symptoms and sedation risk for hydroxyzine. Return in 4-6 weeks if not improved.       Relevant Medications   hydrOXYzine (ATARAX/VISTARIL) 25 MG tablet   FLUoxetine (PROZAC) 20 MG tablet       Return in about 6 weeks (around 01/08/2020), or if symptoms  worsen or fail to improve.  Lesleigh Noe, MD

## 2019-11-27 NOTE — Assessment & Plan Note (Addendum)
Complex hx with SSRI started initially due to post TBI. Denies current depressive symptoms but with worsening symptoms more consistent with anxiety. Following with therapy and has a dog which also helps. Discussed options of switching medications, trial of lower dose of Fluoxetine, or stopping. 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 Will provided hydroxyzine for prn use for severe symptoms discussed SSRI withdrawal symptoms and sedation risk for hydroxyzine. Return in 4-6 weeks if not improved.

## 2019-11-27 NOTE — Patient Instructions (Addendum)
#  Fluoxetine - Tomorrow - start taking 20 mg daily - After 1 week if no withdrawal symptoms (see below) - stop  Dizziness ?Fatigue ?Headache ?Nausea Other common discontinuation symptoms include [1-4]: ?Agitation ?Anxiety ?Chills ?Diaphoresis ?Dysphoria ?Insomnia ?Irritability ?Myalgias ?Paresthesias ?Rhinorrhea  ?Tremor   Can use Hydroxyzine as needed - may make you sleepy

## 2019-12-03 ENCOUNTER — Ambulatory Visit (INDEPENDENT_AMBULATORY_CARE_PROVIDER_SITE_OTHER): Payer: 59 | Admitting: Psychology

## 2019-12-03 DIAGNOSIS — F4310 Post-traumatic stress disorder, unspecified: Secondary | ICD-10-CM | POA: Diagnosis not present

## 2019-12-10 ENCOUNTER — Ambulatory Visit (INDEPENDENT_AMBULATORY_CARE_PROVIDER_SITE_OTHER): Payer: 59 | Admitting: Psychology

## 2019-12-10 DIAGNOSIS — F4310 Post-traumatic stress disorder, unspecified: Secondary | ICD-10-CM | POA: Diagnosis not present

## 2019-12-17 ENCOUNTER — Ambulatory Visit (INDEPENDENT_AMBULATORY_CARE_PROVIDER_SITE_OTHER): Payer: 59 | Admitting: Psychology

## 2019-12-17 DIAGNOSIS — F4310 Post-traumatic stress disorder, unspecified: Secondary | ICD-10-CM | POA: Diagnosis not present

## 2019-12-19 ENCOUNTER — Other Ambulatory Visit: Payer: Self-pay | Admitting: Family Medicine

## 2019-12-19 DIAGNOSIS — F410 Panic disorder [episodic paroxysmal anxiety] without agoraphobia: Secondary | ICD-10-CM

## 2019-12-24 ENCOUNTER — Ambulatory Visit (INDEPENDENT_AMBULATORY_CARE_PROVIDER_SITE_OTHER): Payer: 59 | Admitting: Psychology

## 2019-12-24 ENCOUNTER — Ambulatory Visit: Payer: 59 | Admitting: Psychology

## 2019-12-24 DIAGNOSIS — F4310 Post-traumatic stress disorder, unspecified: Secondary | ICD-10-CM

## 2019-12-29 ENCOUNTER — Ambulatory Visit (INDEPENDENT_AMBULATORY_CARE_PROVIDER_SITE_OTHER): Payer: 59 | Admitting: Psychology

## 2019-12-29 DIAGNOSIS — F4310 Post-traumatic stress disorder, unspecified: Secondary | ICD-10-CM | POA: Diagnosis not present

## 2019-12-31 ENCOUNTER — Ambulatory Visit: Payer: 59 | Admitting: Psychology

## 2019-12-31 ENCOUNTER — Ambulatory Visit (INDEPENDENT_AMBULATORY_CARE_PROVIDER_SITE_OTHER): Payer: 59 | Admitting: Psychology

## 2019-12-31 DIAGNOSIS — F4310 Post-traumatic stress disorder, unspecified: Secondary | ICD-10-CM

## 2020-01-07 ENCOUNTER — Ambulatory Visit (INDEPENDENT_AMBULATORY_CARE_PROVIDER_SITE_OTHER): Payer: 59 | Admitting: Psychology

## 2020-01-07 DIAGNOSIS — F410 Panic disorder [episodic paroxysmal anxiety] without agoraphobia: Secondary | ICD-10-CM | POA: Diagnosis not present

## 2020-01-14 ENCOUNTER — Ambulatory Visit: Payer: 59 | Admitting: Psychology

## 2020-01-21 ENCOUNTER — Ambulatory Visit (INDEPENDENT_AMBULATORY_CARE_PROVIDER_SITE_OTHER): Payer: 59 | Admitting: Psychology

## 2020-01-21 DIAGNOSIS — F4310 Post-traumatic stress disorder, unspecified: Secondary | ICD-10-CM | POA: Diagnosis not present

## 2020-01-28 ENCOUNTER — Ambulatory Visit (INDEPENDENT_AMBULATORY_CARE_PROVIDER_SITE_OTHER): Payer: 59 | Admitting: Psychology

## 2020-01-28 DIAGNOSIS — F4310 Post-traumatic stress disorder, unspecified: Secondary | ICD-10-CM

## 2020-02-04 ENCOUNTER — Ambulatory Visit (INDEPENDENT_AMBULATORY_CARE_PROVIDER_SITE_OTHER): Payer: 59 | Admitting: Psychology

## 2020-02-04 DIAGNOSIS — F4310 Post-traumatic stress disorder, unspecified: Secondary | ICD-10-CM | POA: Diagnosis not present

## 2020-02-11 ENCOUNTER — Ambulatory Visit (INDEPENDENT_AMBULATORY_CARE_PROVIDER_SITE_OTHER): Payer: 59 | Admitting: Psychology

## 2020-02-11 DIAGNOSIS — F4310 Post-traumatic stress disorder, unspecified: Secondary | ICD-10-CM

## 2020-02-18 ENCOUNTER — Ambulatory Visit (INDEPENDENT_AMBULATORY_CARE_PROVIDER_SITE_OTHER): Payer: Managed Care, Other (non HMO) | Admitting: Psychology

## 2020-02-18 DIAGNOSIS — F4310 Post-traumatic stress disorder, unspecified: Secondary | ICD-10-CM

## 2020-02-23 ENCOUNTER — Encounter: Payer: Self-pay | Admitting: Family Medicine

## 2020-02-23 NOTE — Telephone Encounter (Signed)
Monica Forbes - Monica TELEPHONE ADVICE RECORD [facility] Patient Name: Monica Forbes Gender: Female DOB: 1996/05/30 Age: 24 Y 11 M 20 D Return Phone Number: [phone] (Primary), [phone] (Secondary) [place] [address] Monica Forbes - Monica Monica Site [place] [facility] Physician Waunita Schooner- MD Contact Type Call Who Is Calling Patient / Member / Family / Caregiver Call Type Triage / Clinical Caller Name Monica Forbes Relationship To Patient Spouse Return Phone Number [phone] (Primary) Chief Complaint Depression Reason for Call Symptomatic / Request for Health Information Initial Comment Husband is calling for his wife whose depression and anxiety are worsening. He claims she is not a danger to herself but has made bleak, hopeless statements that she just can't deal with everything but no direct statements to harm herself. [place] Not Listed [facility] per husband. Translation No Nurse Assessment Nurse: Monica Sa, RN, Cathy Date/Time (Eastern Time): 02/23/2020 9:00:29 AM Confirm and document reason for call. If symptomatic, describe symptoms. ---Husband states that Monica Forbes has not done anything to harm herself or anyone else. She has not been sharing plans to harm herself or anyone else. She developed increased depression and anxiety last week per husband's concerns. No fever or other symptoms. Alert and responsive. Has the patient had close contact with a person known or suspected to have the novel coronavirus illness OR traveled / lives in area with major community spread (including international travel) in the last 14 days from the onset of symptoms? * If Asymptomatic, screen for exposure and travel within the last 14 days. ---No Does the patient have any new or worsening symptoms? ---Yes Will a triage be completed? ---Yes Related visit to  physician within the last 2 weeks? ---No Does the PT have any chronic conditions? (i.e. diabetes, asthma, this includes High risk factors for pregnancy, etc.) ---Yes List chronic conditions. ---Previous brain injury, Anxiety, Depression, Chronic back pain Is this a behavioral health or substance abuse call? ---No PLEASE NOTE: All timestamps contained within this report are represented as [place] Standard Time. CONFIDENTIALTY NOTICE: This fax transmission is intended only for the addressee. It contains information that is legally privileged, confidential or otherwise protected from use or disclosure. If you are not the intended recipient, you are strictly prohibited from reviewing, disclosing, copying using or disseminating any of this information or taking any action in reliance on or regarding this information. If you have received this fax in error, please notify us immediately by telephone so that we can arrange for its return to [place]. Phone: [phone], Toll-Free: [phone], Fax: [phone] Page: 2 of 2 Call Id: [id number] Guidelines Guideline Title Affirmed Question Affirmed Notes Nurse Date/Time Monica Forbes Time) Depression [1] Depression AND [2] unable to do any of normal activities (e.g., self care, school, work; in comparison to baseline). Monica Sa, RN, Cathy 02/23/2020 9:04:00 AM Disp. Time Monica Forbes Time) Disposition Final User 02/23/2020 9:06:15 AM Go to ED Now Yes Monica Sa, RN, Rosey Bath Disagree/Comply Comply Caller Understands Yes PreDisposition Go to ED Care Advice Given Per Guideline GO TO ED NOW: * You need to be seen in the Emergency Department. * Go to the ED at ___________ [place] now. Drive carefully. ANOTHER ADULT SHOULD DRIVE: * It is better and safer if another adult drives instead of you. CARE ADVICE given per Depression (Adult) guideline. Referrals GO TO FACILITY OTHER - SPECIFY

## 2020-02-23 NOTE — Telephone Encounter (Signed)
Noted  

## 2020-02-23 NOTE — Telephone Encounter (Signed)
Pt already has appt scheduled with Dr Einar Pheasant on 08/10/211 at 4 PM when [facility] RN spoke with pt about moving appt from 02/26/20. Per access nurse note pt does not have SI/HI.FYI to Dr Einar Pheasant as PCP who is not in office today and Glenda Chroman FNP who is in office.

## 2020-02-24 ENCOUNTER — Other Ambulatory Visit: Payer: Self-pay

## 2020-02-24 ENCOUNTER — Encounter: Payer: Self-pay | Admitting: Family Medicine

## 2020-02-24 ENCOUNTER — Ambulatory Visit (INDEPENDENT_AMBULATORY_CARE_PROVIDER_SITE_OTHER): Payer: Managed Care, Other (non HMO) | Admitting: Family Medicine

## 2020-02-24 VITALS — BP 100/62 | HR 71 | Temp 98.1°F | Ht 68.0 in | Wt 215.5 lb

## 2020-02-24 DIAGNOSIS — F411 Generalized anxiety disorder: Secondary | ICD-10-CM

## 2020-02-24 DIAGNOSIS — Z87820 Personal history of traumatic brain injury: Secondary | ICD-10-CM | POA: Diagnosis not present

## 2020-02-24 DIAGNOSIS — F410 Panic disorder [episodic paroxysmal anxiety] without agoraphobia: Secondary | ICD-10-CM | POA: Diagnosis not present

## 2020-02-24 MED ORDER — ALPRAZOLAM 0.25 MG PO TABS: 0.1250 mg | ORAL_TABLET | Freq: Two times a day (BID) | ORAL | 0 refills | Status: DC | PRN

## 2020-02-24 NOTE — Assessment & Plan Note (Signed)
Pt notes worsening symptoms when she pushes herself. Discussed trying not to overstimulate herself or do more than she is able. If anxiety/panic episodes not improving will likely restart SSRI.

## 2020-02-24 NOTE — Assessment & Plan Note (Addendum)
Worsening anxiety with panic episodes. She notes overall generalized anxiety unchanged but 4 days of severe panic episodes not responding to hydroxyzine. Discussed trial of xanax - start with 1/2 tablet. If needing regularly and not improving in the next few days, will recommend SSRI to reduce overall anxiety symptoms. Will also continue weekly therapy - appt tomorrow. MyChart message in 2 weeks to check in.

## 2020-02-24 NOTE — Progress Notes (Signed)
Subjective:     Monica Forbes is a 24 y.o. female presenting for Depression and panic attacks (out of blue and upon wakening )     HPI  #Panic attacks - intense over last 4 days - prior to this would have more mild symptoms  - feelings of overwhelmed by stimulus - light, sound - the better she feels brain injury - the more she pushes herself and doesn't focus on the changes - feels like when she notices the differences from the brain injury - this starts a spiraling episode - self-down, anxiety - difficulty breaking the cycle, brain fatigue, difficulty getting rest - has been having panic attacks -- fast heart rate, waking from sleep in a panic - with flight/fight body response -- even without mental thought changes  - has tried hydroxyzine w/o improvement  Has been doing well from anxiety/depression standpoint until the last few days  Review of Systems   Social History   Tobacco Use  Smoking Status Never Smoker  Smokeless Tobacco Never Used        Objective:    BP Readings from Last 3 Encounters:  02/24/20 100/62  11/27/19 108/70  11/21/19 112/64   Wt Readings from Last 3 Encounters:  02/24/20 215 lb 8 oz (97.8 kg)  11/27/19 218 lb (98.9 kg)  11/21/19 219 lb 3.2 oz (99.4 kg)    BP 100/62   Pulse 71   Temp 98.1 F (36.7 C) (Temporal)   Ht 5\' 8"  (1.727 m)   Wt 215 lb 8 oz (97.8 kg)   LMP 01/28/2020 (Exact Date)   SpO2 98%   BMI 32.77 kg/m    Physical Exam Constitutional:      General: She is not in acute distress.    Appearance: She is well-developed. She is not diaphoretic.  HENT:     Right Ear: External ear normal.     Left Ear: External ear normal.     Nose: Nose normal.  Eyes:     Conjunctiva/sclera: Conjunctivae normal.  Cardiovascular:     Rate and Rhythm: Normal rate.  Pulmonary:     Effort: Pulmonary effort is normal.  Musculoskeletal:     Cervical back: Neck supple.  Skin:    General: Skin is warm and dry.     Capillary Refill:  Capillary refill takes less than 2 seconds.  Neurological:     Mental Status: She is alert. Mental status is at baseline.  Psychiatric:        Attention and Perception: Attention normal.        Mood and Affect: Mood is anxious.        Behavior: Behavior normal.     GAD 7 : Generalized Anxiety Score 02/24/2020 11/27/2019  Nervous, Anxious, on Edge 3 1  Control/stop worrying 3 1  Worry too much - different things 2 1  Trouble relaxing 2 1  Restless 3 1  Easily annoyed or irritable 3 1  Afraid - awful might happen 3 1  Total GAD 7 Score 19 7  Anxiety Difficulty Extremely difficult Somewhat difficult    Depression screen [facility] 2/9 02/24/2020 11/27/2019 06/04/2019  Decreased Interest 1 0 0  Down, Depressed, Hopeless 2 1 0  PHQ - 2 Score 3 1 0  Altered sleeping 2 0 -  Tired, decreased energy 1 0 -  Change in appetite 0 0 -  Feeling bad or failure about yourself  2 0 -  Trouble concentrating 3 1 -  Moving slowly  or fidgety/restless 0 1 -  Suicidal thoughts 1 0 -  PHQ-9 Score 12 3 -  Difficult doing work/chores Very difficult Not difficult at all -  Some recent data might be hidden          Assessment & Plan:   Problem List Items Addressed This Visit      Nervous and Auditory   History of traumatic brain injury    Pt notes worsening symptoms when she pushes herself. Discussed trying not to overstimulate herself or do more than she is able. If anxiety/panic episodes not improving will likely restart SSRI.         Other   Generalized anxiety disorder with panic attacks - Primary    Worsening anxiety with panic episodes. She notes overall generalized anxiety unchanged but 4 days of severe panic episodes not responding to hydroxyzine. Discussed trial of xanax - start with 1/2 tablet. If needing regularly and not improving in the next few days, will recommend SSRI to reduce overall anxiety symptoms. Will also continue weekly therapy - appt tomorrow. MyChart message in 2 weeks to  check in.       Relevant Medications   ALPRAZolam (XANAX) 0.25 MG tablet       Return in about 2 years (around 02/23/2022) for weeks mychart check in.  Lesleigh Noe, MD  This visit occurred during the SARS-CoV-2 public health emergency.  Safety protocols were in place, including screening questions prior to the visit, additional usage of staff PPE, and extensive cleaning of exam room while observing appropriate contact time as indicated for disinfecting solutions.

## 2020-02-24 NOTE — Patient Instructions (Signed)
#  Plan - Stop using Hydroxyzine - Use xanax - start with 1/2 tablet can increase to up to 2 tablets for symptom improvement - try to get in with therapist  If taking regularly for 2 weeks  - call or MyChart as we may need to restart medication - can do Fluoxetine or escitalopram (Lexapro) or sertraline (zoloft)

## 2020-02-25 ENCOUNTER — Encounter: Payer: Self-pay | Admitting: Family Medicine

## 2020-02-25 ENCOUNTER — Ambulatory Visit (INDEPENDENT_AMBULATORY_CARE_PROVIDER_SITE_OTHER): Payer: 59 | Admitting: Psychology

## 2020-02-25 DIAGNOSIS — F4310 Post-traumatic stress disorder, unspecified: Secondary | ICD-10-CM | POA: Diagnosis not present

## 2020-02-26 ENCOUNTER — Ambulatory Visit: Payer: Self-pay | Admitting: Family Medicine

## 2020-03-01 ENCOUNTER — Ambulatory Visit (INDEPENDENT_AMBULATORY_CARE_PROVIDER_SITE_OTHER): Payer: 59 | Admitting: Psychology

## 2020-03-01 DIAGNOSIS — F4310 Post-traumatic stress disorder, unspecified: Secondary | ICD-10-CM

## 2020-03-03 ENCOUNTER — Ambulatory Visit: Payer: 59 | Admitting: Psychology

## 2020-03-08 ENCOUNTER — Encounter: Payer: Self-pay | Admitting: Family Medicine

## 2020-03-08 DIAGNOSIS — F410 Panic disorder [episodic paroxysmal anxiety] without agoraphobia: Secondary | ICD-10-CM

## 2020-03-08 MED ORDER — ESCITALOPRAM OXALATE 10 MG PO TABS: 10.0000 mg | ORAL_TABLET | Freq: Every day | ORAL | 1 refills | Status: DC

## 2020-03-10 ENCOUNTER — Ambulatory Visit: Payer: 59 | Admitting: Psychology

## 2020-03-17 ENCOUNTER — Ambulatory Visit (INDEPENDENT_AMBULATORY_CARE_PROVIDER_SITE_OTHER): Payer: 59 | Admitting: Psychology

## 2020-03-17 DIAGNOSIS — F4310 Post-traumatic stress disorder, unspecified: Secondary | ICD-10-CM

## 2020-03-24 ENCOUNTER — Ambulatory Visit (INDEPENDENT_AMBULATORY_CARE_PROVIDER_SITE_OTHER): Payer: 59 | Admitting: Psychology

## 2020-03-24 DIAGNOSIS — F4310 Post-traumatic stress disorder, unspecified: Secondary | ICD-10-CM | POA: Diagnosis not present

## 2020-03-25 ENCOUNTER — Other Ambulatory Visit: Payer: Self-pay

## 2020-03-25 ENCOUNTER — Ambulatory Visit (HOSPITAL_COMMUNITY)
Admission: EM | Admit: 2020-03-25 | Discharge: 2020-03-25 | Disposition: A | Discharge status: Home / Self Care | Payer: 59

## 2020-03-25 ENCOUNTER — Ambulatory Visit (INDEPENDENT_AMBULATORY_CARE_PROVIDER_SITE_OTHER): Payer: 59 | Admitting: Psychology

## 2020-03-25 DIAGNOSIS — F411 Generalized anxiety disorder: Secondary | ICD-10-CM

## 2020-03-25 DIAGNOSIS — F410 Panic disorder [episodic paroxysmal anxiety] without agoraphobia: Secondary | ICD-10-CM | POA: Diagnosis not present

## 2020-03-25 DIAGNOSIS — F4310 Post-traumatic stress disorder, unspecified: Secondary | ICD-10-CM

## 2020-03-25 NOTE — Discharge Instructions (Addendum)
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
## 2020-03-25 NOTE — ED Notes (Signed)
Reviewed AVS with pt/significant other. Verbalized understanding of all discharge instructions to include appointments. Pt/significant other escorted to locker #25 to retrieve belongings, then to lobby to self. Safety maintained.

## 2020-03-25 NOTE — BH Assessment (Signed)
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  Reality Testing: Realistic  Insight:  Insight: Good  Decision Making:  Decision Making: Impulsive  Social Functioning  Social Maturity:  Social Maturity: Responsible  Social Judgement:  Social Judgement: Normal  Stress  Stressors:  Stressors: Grief/losses, Illness, Work  Coping Ability:  Coping Ability: English as a second language teacher Deficits:  Skill Deficits: Activities of daily living, Self-care  Supports:  Supports: Family     Religion: Religion/Spirituality Are You A Religious Person?:  (not asessed)  Leisure/Recreation: Leisure / Recreation Do You Have Hobbies?:  Yes Leisure and Hobbies: Merchandiser, retail  Exercise/Diet: Exercise/Diet Do You Exercise?: No Have You Gained or Lost A Significant Amount of Weight in the Past Six Months?: No Do You Follow a Special Diet?: No Do You Have Any Trouble Sleeping?: No   CCA Employment/Education  Employment/Work Situation: Employment / Work Situation Employment situation:  Employed (works part-time at [employer]) Where is patient currently employed[employer] long has patient been employed?: not assessed Patient's job has been impacted by current illness: Yes Describe how patient's job has been impacted: memory issues, easily distracted What is the longest time patient has a held a job?: unable to assess Where was the patient employed at that time?: unable to assess Has patient ever been in the [employer]?: No  Education: Education Is Patient Currently Attending School?: No Last Grade Completed: 12 Name of [place]: not assessed Did Teacher, adult education From [employer]?: Yes Did Physicist, medical?: Yes ([employer]) What Type of College Degree Do you Have?: BA Did You Attend Graduate School?: Yes What is Your Teacher, English as a foreign language Degree?: MA What Was Your Major?: Education Did You Have Any Special Interests In School?: n?A Did You Have An Individualized Education Program (IIEP): No Did You Have Any Difficulty At School?: No Patient's Education Has Been Impacted by Current Illness: No   CCA Family/Childhood History  Family and Relationship History: Family history Marital status: Married Number of Years Married: 2 What types of issues is patient dealing with in the relationship?: Patient's husband is very supportive Are you sexually active?: Yes What is your sexual orientation?: heterosexual Has your sexual activity been affected by drugs, alcohol, medication, or emotional stress?: N/A Does patient have children?: No  Childhood History:  Childhood History By whom was/is the patient raised?:  Mother Additional childhood history information: Parents divorced Description of patient's relationship with caregiver when they were a child: Patient states that she was close to her mother growing up Patient's description of current relationship with people who raised him/her: Patient has maintained a good relationship with both parents, but she is closest to her mother How were you disciplined when you got in trouble as a child/adolescent?: Patient states that she was disciplined appropriately Does patient have siblings?: Yes Number of Siblings: 2 (half brothers are by his father) Description of patient's current relationship with siblings: patient states that she is relatively close to her brothers, they are younger than she is. Did patient suffer any verbal/emotional/physical/sexual abuse as a child?: No Did patient suffer from severe childhood neglect?: No Has patient ever been sexually abused/assaulted/raped as an adolescent or adult?: No Was the patient ever a victim of a crime or a disaster?: No Witnessed domestic violence?: No Has patient been affected by domestic violence as an adult?: No  Child/Adolescent Assessment:     CCA Substance Use  Alcohol/Drug Use:                           ASAM's:  Six Dimensions of Multidimensional Assessment  Dimension 1:  Acute Intoxication and/or Withdrawal Potential:      Dimension 2:  Biomedical Conditions and Complications:      Dimension 3:  Emotional, Behavioral, or Cognitive Conditions and Complications:     Dimension 4:  Readiness to Change:     Dimension 5:  Relapse, Continued use, or Continued Problem Potential:     Dimension 6:  Recovery/Living Environment:     ASAM Severity Score:    ASAM Recommended Level of Treatment:     Substance use Disorder (SUD)    Recommendations for Services/Supports/Treatments:    DSM5 Diagnoses: Patient Active Problem List   Diagnosis Date Noted  . Generalized anxiety disorder  11/27/2019  . Rectal bleeding 11/06/2019  . Ovarian cyst 10/06/2019  . Irregular menstrual cycle 10/06/2019  .  Menorrhagia with regular cycle 06/04/2019  . 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 Humerus fracture 02/06/2018  . History of traumatic brain injury 01/12/2018    Disposition:  Per Letitia Libra, NP, patient does not meet inpatient admission criteria and can f/u with an outpatient provider.  Patient was provided with referral information.     Referrals to Alternative Service(s): Referred to Alternative Service(s):   Place:   Date:   Time:    Referred to Alternative Service(s):   Place:   Date:   Time:    Referred to Alternative Service(s):   Place:   Date:   Time:    Referred to Alternative Service(s):   Place:   Date:   Time:     Judeth Porch Natalio Salois

## 2020-03-26 ENCOUNTER — Encounter: Payer: Self-pay | Admitting: Family Medicine

## 2020-03-30 ENCOUNTER — Other Ambulatory Visit: Payer: Self-pay | Admitting: Family Medicine

## 2020-03-30 DIAGNOSIS — F410 Panic disorder [episodic paroxysmal anxiety] without agoraphobia: Secondary | ICD-10-CM

## 2020-03-30 DIAGNOSIS — F411 Generalized anxiety disorder: Secondary | ICD-10-CM

## 2020-03-30 NOTE — Telephone Encounter (Signed)
Monica Forbes is scheduled to follow up with Dr. Einar Pheasant on Wednesday 03/31/2020.  Will forward to Dr. Einar Pheasant to refill for 90 days if appropriate after her appointment.

## 2020-03-31 ENCOUNTER — Ambulatory Visit (INDEPENDENT_AMBULATORY_CARE_PROVIDER_SITE_OTHER): Payer: Managed Care, Other (non HMO) | Admitting: Family Medicine

## 2020-03-31 ENCOUNTER — Other Ambulatory Visit: Payer: Self-pay

## 2020-03-31 ENCOUNTER — Encounter: Payer: Self-pay | Admitting: Family Medicine

## 2020-03-31 ENCOUNTER — Ambulatory Visit (INDEPENDENT_AMBULATORY_CARE_PROVIDER_SITE_OTHER): Payer: 59 | Admitting: Psychology

## 2020-03-31 VITALS — BP 110/60 | HR 80 | Temp 97.9°F | Ht 68.0 in | Wt 210.8 lb

## 2020-03-31 DIAGNOSIS — F411 Generalized anxiety disorder: Secondary | ICD-10-CM | POA: Diagnosis not present

## 2020-03-31 DIAGNOSIS — Z23 Encounter for immunization: Secondary | ICD-10-CM | POA: Diagnosis not present

## 2020-03-31 DIAGNOSIS — F4310 Post-traumatic stress disorder, unspecified: Secondary | ICD-10-CM | POA: Diagnosis not present

## 2020-03-31 DIAGNOSIS — Z87820 Personal history of traumatic brain injury: Secondary | ICD-10-CM

## 2020-03-31 MED ORDER — ALPRAZOLAM ER 0.5 MG PO TB24: 0.5000 mg | ORAL_TABLET | ORAL | 0 refills | Status: DC | PRN

## 2020-03-31 NOTE — Patient Instructions (Addendum)
Get help right away if:  You have any symptoms of stroke. "BE FAST" is an easy way to remember the main warning signs: ? B - Balance. Signs are dizziness, sudden trouble walking, or loss of balance. ? E - Eyes. Signs are trouble seeing or a sudden change in how you see. ? F - Face. Signs are sudden weakness or loss of feeling of the face, or the face or eyelid drooping on one side. ? A - Arms. Signs are weakness or loss of feeling in an arm. This happens suddenly and usually on one side of the body. ? S - Speech. Signs are sudden trouble speaking, slurred speech, or trouble understanding what people say. ? T - Time. Time to call emergency services. Write down what time symptoms started.  You have other signs of stroke, such as: ? A sudden, very bad headache with no known cause. ? Feeling sick to your stomach (nausea). ? Throwing up (vomiting). ? Jerky movements you cannot control (seizure). These symptoms may represent a serious problem that is an emergency. Do not wait to see if the symptoms will go away. Get medical help right away. Call your local emergency services (911 in the [place] Do not drive yourself to the hospital.   - Try Xanax as needed if you get another episode - continue Lexapro - can consider dose increase in a few weeks if still noticing some symptoms  #Referral I have placed a referral to a specialist for you. You should receive a phone call from the specialty office. Make sure your voicemail is not full and that if you are able to answer your phone to unknown or new numbers.   It may take up to 2 weeks to hear about the referral. If you do not hear anything in 2 weeks, please call our office and ask to speak with the referral coordinator.
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
## 2020-03-31 NOTE — Assessment & Plan Note (Signed)
GAD and PHQ-9 are improved, however, did have mental health crisis last week and went to behavioral ER. Denies SI/HI but endorses significant anxiety over symptoms and being uncertain of link to brain trauma or if she should be concerned. Discussed that her symptoms (of immobility) could be related to panic attack and if they happen in the future to try xanax -- this helped her feel better after assess. Does not have lingering neurological deficits and provided handout on stroke symptoms for reassurance if symptoms occur again. At this point do suspect more mood/anxiety related however, it may be linked to her head injury. Psych referral to help with management. Already in therapy. Advised neurology f/u given complexity and worsening of symptoms to see if it may be related to head injury and help guide if additional treatment is needed. Cont prn xanax. Cont lexapro 10 mg but can increase in 1-2 weeks. Return in 3-4 weeks unless able to see psych for check-in.

## 2020-04-07 ENCOUNTER — Ambulatory Visit (INDEPENDENT_AMBULATORY_CARE_PROVIDER_SITE_OTHER): Payer: 59 | Admitting: Psychology

## 2020-04-07 DIAGNOSIS — F4310 Post-traumatic stress disorder, unspecified: Secondary | ICD-10-CM | POA: Diagnosis not present

## 2020-04-14 ENCOUNTER — Ambulatory Visit (INDEPENDENT_AMBULATORY_CARE_PROVIDER_SITE_OTHER): Payer: 59 | Admitting: Psychology

## 2020-04-14 DIAGNOSIS — F4310 Post-traumatic stress disorder, unspecified: Secondary | ICD-10-CM

## 2020-04-21 ENCOUNTER — Ambulatory Visit (INDEPENDENT_AMBULATORY_CARE_PROVIDER_SITE_OTHER): Payer: 59 | Admitting: Psychology

## 2020-04-21 DIAGNOSIS — F4310 Post-traumatic stress disorder, unspecified: Secondary | ICD-10-CM

## 2020-04-28 ENCOUNTER — Ambulatory Visit (INDEPENDENT_AMBULATORY_CARE_PROVIDER_SITE_OTHER): Payer: 59 | Admitting: Psychology

## 2020-04-28 ENCOUNTER — Encounter: Payer: Self-pay | Admitting: Family Medicine

## 2020-04-28 ENCOUNTER — Other Ambulatory Visit: Payer: Self-pay

## 2020-04-28 ENCOUNTER — Ambulatory Visit (INDEPENDENT_AMBULATORY_CARE_PROVIDER_SITE_OTHER): Payer: Managed Care, Other (non HMO) | Admitting: Family Medicine

## 2020-04-28 VITALS — BP 102/70 | HR 77 | Temp 98.2°F | Ht 68.0 in | Wt 211.8 lb

## 2020-04-28 DIAGNOSIS — F411 Generalized anxiety disorder: Secondary | ICD-10-CM | POA: Diagnosis not present

## 2020-04-28 DIAGNOSIS — F4310 Post-traumatic stress disorder, unspecified: Secondary | ICD-10-CM

## 2020-04-28 DIAGNOSIS — F5104 Psychophysiologic insomnia: Secondary | ICD-10-CM | POA: Diagnosis not present

## 2020-04-28 NOTE — Assessment & Plan Note (Addendum)
Epworth sleepiness 2. Wonder if this is medication associated and advised switching lexapro to AM dosing. OK to continue xanax 0.25-0.5 mg occasionally but if needing more than 2-4 times per week would recommend trying Trazodone as OTC medications do not help her get restful sleep. She will message back if sleep is not improving
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
## 2020-04-28 NOTE — Progress Notes (Signed)
Subjective:     Monica Forbes is a 24 y.o. female presenting for Follow-up     HPI   #GAD - doing well on 10 mg lexapro - has neurology appt early November - taking the lexapro before bed - having a hard time getting restful sleep at night  Previously tried  - melatonin in different forms - was falling asleep - felt like she is very restless - tried zzzquil - puts her sleep but not getting restful sleep - tossing a turning at night - started taking xanax before bed - and got restful sleep - woke up and showered and felt really good doing this - restlessness has been for about 4 weeks - Monday just fell asleep on the couch   Review of Systems   Social History   Tobacco Use  Smoking Status Never Smoker  Smokeless Tobacco Never Used        Objective:    BP Readings from Last 3 Encounters:  04/28/20 102/70  03/31/20 110/60  02/24/20 100/62   Wt Readings from Last 3 Encounters:  04/28/20 211 lb 12 oz (96 kg)  03/31/20 210 lb 12 oz (95.6 kg)  02/24/20 215 lb 8 oz (97.8 kg)    BP 102/70   Pulse 77   Temp 98.2 F (36.8 C) (Temporal)   Ht 5\' 8"  (1.727 m)   Wt 211 lb 12 oz (96 kg)   LMP 04/11/2020 (Exact Date)   SpO2 98%   BMI 32.20 kg/m    Physical Exam Constitutional:      General: She is not in acute distress.    Appearance: She is well-developed. She is not diaphoretic.  HENT:     Right Ear: External ear normal.     Left Ear: External ear normal.  Eyes:     Conjunctiva/sclera: Conjunctivae normal.  Cardiovascular:     Rate and Rhythm: Normal rate.  Pulmonary:     Effort: Pulmonary effort is normal.  Musculoskeletal:     Cervical back: Neck supple.  Skin:    General: Skin is warm and dry.     Capillary Refill: Capillary refill takes less than 2 seconds.  Neurological:     Mental Status: She is alert. Mental status is at baseline.  Psychiatric:        Mood and Affect: Mood normal.        Behavior: Behavior normal.     GAD 7 :  Generalized Anxiety Score 04/28/2020 03/31/2020 02/24/2020 11/27/2019  Nervous, Anxious, on Edge 1 1 3 1   Control/stop worrying 0 1 3 1   Worry too much - different things 0 1 2 1   Trouble relaxing 1 1 2 1   Restless 1 1 3 1   Easily annoyed or irritable 0 2 3 1   Afraid - awful might happen 0 2 3 1   Total GAD 7 Score 3 9 19 7   Anxiety Difficulty Somewhat difficult - Extremely difficult Somewhat difficult         Assessment & Plan:   Problem List Items Addressed This Visit      Other   Generalized anxiety disorder - Primary    Significantly improved on lexapro 10 mg - continue this dose. Switch to AM dosing to see if sleep improves. Discussed that given her prior head injury she may benefit from longterm SSRI treatment for prevention of mood disorder symptoms. She is in agreement with this but we could also consider trials off medications in the future.  Psychophysiological insomnia    Epworth sleepiness 2. Wonder if this is medication associated and advised switching lexapro to AM dosing. OK to continue xanax 0.25-0.5 mg occasionally but if needing more than 2-4 times per week would recommend trying Trazodone as OTC medications do not help her get restful sleep. She will message back if sleep is not improving          Return if symptoms worsen or fail to improve.  Lesleigh Noe, MD  This visit occurred during the SARS-CoV-2 public health emergency.  Safety protocols were in place, including screening questions prior to the visit, additional usage of staff PPE, and extensive cleaning of exam room while observing appropriate contact time as indicated for disinfecting solutions.

## 2020-04-28 NOTE — Patient Instructions (Signed)
#   Insomnia - try to switch the Lexapro to the morning - OK to continue to use Xanax occasionally for sleep - Send me a message if you feel like trying Trazodone for nightly sleep  Continue lexapro

## 2020-05-05 ENCOUNTER — Ambulatory Visit (INDEPENDENT_AMBULATORY_CARE_PROVIDER_SITE_OTHER): Payer: 59 | Admitting: Psychology

## 2020-05-05 DIAGNOSIS — F4310 Post-traumatic stress disorder, unspecified: Secondary | ICD-10-CM | POA: Diagnosis not present

## 2020-05-06 ENCOUNTER — Encounter: Payer: Managed Care, Other (non HMO) | Attending: Psychology | Admitting: Psychology

## 2020-05-06 ENCOUNTER — Other Ambulatory Visit: Payer: Self-pay

## 2020-05-06 DIAGNOSIS — F09 Unspecified mental disorder due to known physiological condition: Secondary | ICD-10-CM | POA: Diagnosis not present

## 2020-05-06 DIAGNOSIS — S069X4D Unspecified intracranial injury with loss of consciousness of 6 hours to 24 hours, subsequent encounter: Secondary | ICD-10-CM

## 2020-05-06 DIAGNOSIS — F411 Generalized anxiety disorder: Secondary | ICD-10-CM

## 2020-05-07 ENCOUNTER — Encounter: Payer: Self-pay | Admitting: Family Medicine

## 2020-05-11 ENCOUNTER — Ambulatory Visit (INDEPENDENT_AMBULATORY_CARE_PROVIDER_SITE_OTHER): Payer: 59 | Admitting: Psychology

## 2020-05-11 DIAGNOSIS — F4310 Post-traumatic stress disorder, unspecified: Secondary | ICD-10-CM | POA: Diagnosis not present

## 2020-05-12 ENCOUNTER — Ambulatory Visit: Payer: 59 | Admitting: Psychology

## 2020-05-17 ENCOUNTER — Encounter: Payer: Self-pay | Admitting: Psychology

## 2020-05-17 NOTE — Progress Notes (Signed)
05/06/2020: 4 PM-5 PM: Today's visit was an in person visit that was conducted in my outpatient clinic office with the patient myself present.  I last saw the patient on 03/10/2020 to provide feedback regarding the formal neuropsychological testing and had seen her several times in the spring and early summer 2020 with regard to issues associated with her traumatic brain injury.  Please see patient's EMR for full details about those events.  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  The patient did well in some classes but had significant difficulties in other parts of her education in particular issues with being overstimulated in classroom settings.  She ended up withdrawing from her graduate program and working several various jobs in the meantime.  While cognitively she was able to do most of the work both with her school program as well as job attempts when external stimuli were held in check.  However, the patient becomes easily overstimulated and it would exhaust her to get through her day.  The patient has had to stop doing each of her jobs but continues to look at jobs that she may be able to do.  This is been very distressing to the patient and impacted her confidence and ability to cope with the residual effects of her traumatic brain injury.  We have set the patient up for further psychotherapeutic interventions going forward to work on coping and adjustment issues.

## 2020-05-19 ENCOUNTER — Ambulatory Visit (INDEPENDENT_AMBULATORY_CARE_PROVIDER_SITE_OTHER): Payer: 59 | Admitting: Psychology

## 2020-05-19 DIAGNOSIS — F4310 Post-traumatic stress disorder, unspecified: Secondary | ICD-10-CM

## 2020-05-25 ENCOUNTER — Ambulatory Visit: Payer: Managed Care, Other (non HMO) | Admitting: Psychology

## 2020-05-26 ENCOUNTER — Ambulatory Visit (INDEPENDENT_AMBULATORY_CARE_PROVIDER_SITE_OTHER): Payer: 59 | Admitting: Psychology

## 2020-05-26 DIAGNOSIS — F4310 Post-traumatic stress disorder, unspecified: Secondary | ICD-10-CM

## 2020-05-31 ENCOUNTER — Ambulatory Visit (INDEPENDENT_AMBULATORY_CARE_PROVIDER_SITE_OTHER): Payer: 59 | Admitting: Psychology

## 2020-05-31 DIAGNOSIS — F4310 Post-traumatic stress disorder, unspecified: Secondary | ICD-10-CM | POA: Diagnosis not present

## 2020-06-02 ENCOUNTER — Ambulatory Visit: Payer: 59 | Admitting: Psychology

## 2020-06-07 ENCOUNTER — Other Ambulatory Visit: Payer: Self-pay

## 2020-06-07 ENCOUNTER — Ambulatory Visit (INDEPENDENT_AMBULATORY_CARE_PROVIDER_SITE_OTHER): Payer: Managed Care, Other (non HMO) | Admitting: Family Medicine

## 2020-06-07 ENCOUNTER — Ambulatory Visit: Payer: Managed Care, Other (non HMO) | Attending: Internal Medicine

## 2020-06-07 ENCOUNTER — Encounter: Payer: Self-pay | Admitting: Family Medicine

## 2020-06-07 VITALS — BP 90/60 | HR 79 | Temp 97.7°F | Ht 68.5 in | Wt 208.5 lb

## 2020-06-07 DIAGNOSIS — Z0000 Encounter for general adult medical examination without abnormal findings: Secondary | ICD-10-CM

## 2020-06-07 DIAGNOSIS — F411 Generalized anxiety disorder: Secondary | ICD-10-CM

## 2020-06-07 DIAGNOSIS — E782 Mixed hyperlipidemia: Secondary | ICD-10-CM

## 2020-06-07 DIAGNOSIS — Z23 Encounter for immunization: Secondary | ICD-10-CM

## 2020-06-07 LAB — COMPREHENSIVE METABOLIC PANEL
ALT: 10 U/L (ref 0–35)
AST: 14 U/L (ref 0–37)
Albumin: 4.6 g/dL (ref 3.5–5.2)
Alkaline Phosphatase: 65 U/L (ref 39–117)
BUN: 17 mg/dL (ref 6–23)
CO2: 25 mEq/L (ref 19–32)
Calcium: 9.3 mg/dL (ref 8.4–10.5)
Chloride: 105 mEq/L (ref 96–112)
Creatinine, Ser: 0.71 mg/dL (ref 0.40–1.20)
GFR: 117.48 mL/min (ref >60.00)
Glucose, Bld: 82 mg/dL (ref 70–99)
Potassium: 3.8 mEq/L (ref 3.5–5.1)
Sodium: 138 mEq/L (ref 135–145)
Total Bilirubin: 0.9 mg/dL (ref 0.2–1.2)
Total Protein: 7.2 g/dL (ref 6.0–8.3)

## 2020-06-07 LAB — LIPID PANEL
Cholesterol: 208 mg/dL — ABNORMAL HIGH (ref 0–200)
HDL: 51.1 mg/dL (ref >39.00)
LDL Cholesterol: 129 mg/dL — ABNORMAL HIGH (ref 0–99)
NonHDL: 157.21
Total CHOL/HDL Ratio: 4
Triglycerides: 140 mg/dL (ref 0.0–149.0)
VLDL: 28 mg/dL (ref 0.0–40.0)

## 2020-06-07 LAB — CBC
HCT: 37.9 % (ref 36.0–46.0)
Hemoglobin: 12.4 g/dL (ref 12.0–15.0)
MCHC: 32.9 g/dL (ref 30.0–36.0)
MCV: 81.4 fl (ref 78.0–100.0)
Platelets: 213 10*3/uL (ref 150.0–400.0)
RBC: 4.65 Mil/uL (ref 3.87–5.11)
RDW: 14 % (ref 11.5–15.5)
WBC: 6.5 10*3/uL (ref 4.0–10.5)

## 2020-06-07 NOTE — Assessment & Plan Note (Signed)
Slightly elevated 2019. Repeat today. Cont healthy diet and exercise

## 2020-06-07 NOTE — Progress Notes (Signed)
   Covid-19 Vaccination Clinic  Name:  TAYONNA BACHA    MRN: [id number] DOB: 1995/11/19  06/07/2020  Ms. Baumgarner was observed post Covid-19 immunization for 15 minutes without incident. She was provided with Vaccine Information Sheet and instruction to access the V-Safe system.   Ms. Cavalieri was instructed to call 911 with any severe reactions post vaccine: Marland Kitchen Difficulty breathing  . Swelling of face and throat  . A fast heartbeat  . A bad rash all over body  . Dizziness and weakness   Immunizations Administered    Name Date Dose VIS Date Route   JANSSEN COVID-19 VACCINE 06/07/2020  1:55 PM 0.5 mL 05/05/2020 Intramuscular   Manufacturer: [employer]   Lot: [id number]   [place]: [id number]

## 2020-06-07 NOTE — Patient Instructions (Signed)
Continue to work on [employer] and exercise  Labs today  Get your booster shot!

## 2020-06-07 NOTE — Progress Notes (Signed)
Annual Exam   Chief Complaint:  Chief Complaint  Patient presents with  . Annual Exam    no concerns     History of Present Illness:  Ms. Monica Forbes is a 24 y.o. No obstetric history on file. who LMP was Patient's last menstrual period was 05/12/2020 (exact date)., presents today for her annual examination.    Anxiety and depression are improved   Nutrition/Lifestyle Diet: hello fresh meals - healthy meals, trying different proteins Exercise: improving exercise ability, hiking She does get adequate calcium and Vitamin D in her diet.  Social History   Tobacco Use  Smoking Status Never Smoker  Smokeless Tobacco Never Used   Social History   Substance and Sexual Activity  Alcohol Use Not Currently   Comment: a couple times a year   Social History   Substance and Sexual Activity  Drug Use Never     Safety The patient wears seatbelts: yes.     The patient feels safe at home and in their relationships: yes.  General Health Dentist in the last year: Yes Eye doctor: yes  Menstrual Physical symptoms are improved Flow is more consistent, 4-5 days, once a month    GYN She is single partner, contraception - condoms most of the time.     Cervical Cancer Screening (Age 9-65) Last Pap:  December 2019 Results were: no abnormalities with HPV not done  Family History of Breast Cancer: no Family History of Ovarian Cancer: no    Weight Wt Readings from Last 3 Encounters:  06/07/20 208 lb 8 oz (94.6 kg)  04/28/20 211 lb 12 oz (96 kg)  03/31/20 210 lb 12 oz (95.6 kg)   Patient has high BMI  BMI Readings from Last 1 Encounters:  06/07/20 31.24 kg/m     Chronic disease screening Blood pressure monitoring:  BP Readings from Last 3 Encounters:  06/07/20 90/60  04/28/20 102/70  03/31/20 110/60     Lipid Monitoring: Indication for screening: age >66, obesity, diabetes, family hx, CV risk factors.  Lipid screening: Yes  Lab Results  Component Value  Date   CHOL 259 (A) 06/18/2018   HDL 57 06/18/2018   LDLCALC 169 06/18/2018   TRIG 164 (A) 06/18/2018     Diabetes Screening: age >65, overweight, family hx, PCOS, hx of gestational diabetes, at risk ethnicity, elevated blood pressure >135/80.  Diabetes Screening screening: Yes  No results found for: HGBA1C    Past Medical History:  Diagnosis Date  . TBI (traumatic brain injury) [facility])     Past Surgical History:  Procedure Laterality Date  . External Fixator in Left arm  2019  . HIP ARTHROSCOPY Right 2020   at [facility]  . SPINAL FUSION  2019  . TONSILLECTOMY      Prior to Admission medications   Medication Sig Start Date End Date Taking? Authorizing Provider  ALPRAZolam (XANAX XR) 0.5 MG 24 hr tablet Take 1 tablet (0.5 mg total) by mouth as needed for anxiety. Take 0.5-2 tablets as needed 03/31/20  Yes Lesleigh Noe, MD  escitalopram (LEXAPRO) 10 MG tablet TAKE 1 TABLET BY MOUTH EVERY DAY 03/31/20  Yes Lesleigh Noe, MD    Allergies  Allergen Reactions  . Pineapple Rash    Gynecologic History: Patient's last menstrual period was 05/12/2020 (exact date).  Obstetric History: No obstetric history on file.  Social History   Socioeconomic History  . Marital status: Married    Spouse name: [name]  . Number of  children: 0  . Years of education: PhD currently  . Highest education level: Not on file  Occupational History  . Not on file  Tobacco Use  . Smoking status: Never Smoker  . Smokeless tobacco: Never Used  Vaping Use  . Vaping Use: Never used  Substance and Sexual Activity  . Alcohol use: Not Currently    Comment: a couple times a year  . Drug use: Never  . Sexual activity: Yes    Birth control/protection: Condom  Other Topics Concern  . 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 Difficulty of Paying Living Expenses: Not on file  Food Insecurity:   . Worried About Charity fundraiser in the Last Year: Not on file  . Ran Out of Food in the Last Year: Not on file  Transportation Needs:   . Lack of Transportation (Medical): Not on file  . Lack of Transportation (Non-Medical): Not on file  Physical Activity:   . Days of Exercise per Week: Not on file  . Minutes of Exercise per Session: Not on file  Stress:   . Feeling of Stress : Not on file  Social Connections:   . Frequency of Communication with Friends and Family: Not on file  . Frequency of Social Gatherings with Friends and Family: Not on file  . Attends Religious Services: Not on file  . Active Member of Clubs or Organizations: Not on file  . Attends Archivist Meetings: Not on file  . Marital Status: Not on file  Intimate Partner Violence:   . Fear of Current or Ex-Partner: Not on file  . Emotionally Abused: Not on file  . Physically Abused: Not on file  . Sexually Abused: Not on file    Family History  Problem Relation Age of Onset  . Hyperlipidemia Father   . Hypertension Father   . Gout Father   . Gout Paternal Grandmother   . Hyperlipidemia Paternal Grandfather   . Heart disease Paternal Grandfather        triple bypass    Review of Systems  Constitutional: Negative for chills and fever.  HENT: Negative for congestion and sore throat.   Eyes: Negative for blurred vision and double vision.  Respiratory: Negative for shortness of breath.   Cardiovascular: Negative for chest  pain.  Gastrointestinal: Negative for heartburn, nausea and vomiting.  Genitourinary: Negative.   Musculoskeletal: Negative.  Negative for myalgias.  Skin: Negative for rash.  Neurological: Negative for dizziness and headaches.  Endo/Heme/Allergies: Does not bruise/bleed easily.  Psychiatric/Behavioral: Negative for depression. The patient is not nervous/anxious.      Physical Exam BP 90/60   Pulse 79   Temp 97.7 F (36.5 C) (Temporal)  Ht 5' 8.5" (1.74 m)   Wt 208 lb 8 oz (94.6 kg)   LMP 05/12/2020 (Exact Date)   SpO2 98%   BMI 31.24 kg/m    BP Readings from Last 3 Encounters:  06/07/20 90/60  04/28/20 102/70  03/31/20 110/60    Wt Readings from Last 3 Encounters:  06/07/20 208 lb 8 oz (94.6 kg)  04/28/20 211 lb 12 oz (96 kg)  03/31/20 210 lb 12 oz (95.6 kg)     Physical Exam Constitutional:      General: She is not in acute distress.    Appearance: She is well-developed. She is not diaphoretic.  HENT:     Head: Normocephalic and atraumatic.     Right Ear: External ear normal.     Left Ear: External ear normal.     Nose: Nose normal.  Eyes:     General: No scleral icterus.    Conjunctiva/sclera: Conjunctivae normal.  Cardiovascular:     Rate and Rhythm: Normal rate and regular rhythm.     Heart sounds: No murmur heard.   Pulmonary:     Effort: Pulmonary effort is normal. No respiratory distress.     Breath sounds: Normal breath sounds. No wheezing.  Abdominal:     General: Bowel sounds are normal. There is no distension.     Palpations: Abdomen is soft. There is no mass.     Tenderness: There is no abdominal tenderness. There is no guarding or rebound.  Musculoskeletal:        General: Normal range of motion.     Cervical back: Neck supple.  Lymphadenopathy:     Cervical: No cervical adenopathy.  Skin:    General: Skin is warm and dry.     Capillary Refill: Capillary refill takes less than 2 seconds.  Neurological:     Mental Status: She is alert  and oriented to person, place, and time.     Deep Tendon Reflexes: Reflexes normal.  Psychiatric:        Behavior: Behavior normal.       Results: Depression screen [facility] 2/9 03/31/2020 02/24/2020 11/27/2019  Decreased Interest 0 1 0  Down, Depressed, Hopeless _0 PHQ - 2 Score _1 Altered sleeping 0 2 0  Tired, decreased energy 0 1 0  Change in appetite 0 0 0  Feeling bad or failure about yourself  1 2 0  Trouble concentrating 0 3 1  Moving slowly or fidgety/restless 0 0 1  Suicidal thoughts 1 1 0  PHQ-9 Score _2 Difficult doing work/chores - Very difficult Not difficult at all  Some encounter information is confidential and restricted. Go to Review Flowsheets activity to see all data.  Some recent data might be hidden      Assessment: 24 y.o. No obstetric history on file. female here for routine annual examination.  Plan: Problem List Items Addressed This Visit      Other   Generalized anxiety disorder    Remission. Much improved. Cont lexapro and prn xanax      Mixed hyperlipidemia    Slightly elevated 2019. Repeat today. Cont healthy diet and exercise      Relevant Orders   Lipid panel    Other Visit Diagnoses    Annual physical exam    -  Primary   Relevant Orders   Comprehensive metabolic panel   CBC       Screening: -- Blood pressure screen normal -- cholesterol  screening: will obtain -- Weight screening: overweight: continue to monitor -- Diabetes Screening: will obtain -- Nutrition: encouraged healthy diet   Psych -- Depression screening (PHQ-9):    Office Visit from 03/31/2020 in [place] at [facility]  PHQ-9 Total Score 3       Safety -- tobacco screening: not using -- alcohol screening:  low-risk usage. -- no evidence of domestic violence or intimate partner violence.   Cancer Screening -- pap smear not collected per ASCCP guidelines -- family history of breast cancer screening: done. not at high  risk.   Immunizations Immunization History  Administered Date(s) Administered  . HPV Quadrivalent 01/16/2011, 03/21/2011, 07/24/2011  . Hepatitis A 09/24/2009, 01/16/2011  . Hepatitis B 1996-02-14, 04/07/1994, 11/30/1994  . Influenza,inj,Quad PF,6+ Mos 06/04/2019, 03/31/2020  . Janssen (J&J) SARS-COV-2 Vaccination 09/25/2019  . MMR 03/22/1995, 03/08/1998  . Meningococcal Conjugate 01/16/2011  . Tdap 07/24/2008, 06/04/2019    -- flu vaccine up to date -- TDAP q10 years up to date -- HPV vaccination series (Age <26, shared decision 27-45): received -- Covid-19 Vaccine up to date  Encouraged regular vision and dental screening. Encouraged healthy exercise and diet.   Lesleigh Noe

## 2020-06-07 NOTE — Assessment & Plan Note (Signed)
Remission. Much improved. Cont lexapro and prn xanax

## 2020-06-09 ENCOUNTER — Ambulatory Visit (INDEPENDENT_AMBULATORY_CARE_PROVIDER_SITE_OTHER): Payer: Managed Care, Other (non HMO) | Admitting: Psychology

## 2020-06-09 DIAGNOSIS — F4310 Post-traumatic stress disorder, unspecified: Secondary | ICD-10-CM | POA: Diagnosis not present

## 2020-06-14 ENCOUNTER — Telehealth: Payer: Self-pay | Admitting: Obstetrics and Gynecology

## 2020-06-14 NOTE — Telephone Encounter (Signed)
Pt would like coper IUD PLACED AT ANNUAL APPT ON 07/23/20 AT 8:30 AM

## 2020-06-15 NOTE — Telephone Encounter (Signed)
Noted. Will order to arrive by apt date/time. 

## 2020-06-16 ENCOUNTER — Ambulatory Visit (INDEPENDENT_AMBULATORY_CARE_PROVIDER_SITE_OTHER): Payer: 59 | Admitting: Psychology

## 2020-06-16 DIAGNOSIS — F4310 Post-traumatic stress disorder, unspecified: Secondary | ICD-10-CM | POA: Diagnosis not present

## 2020-06-23 ENCOUNTER — Ambulatory Visit (INDEPENDENT_AMBULATORY_CARE_PROVIDER_SITE_OTHER): Payer: 59 | Admitting: Psychology

## 2020-06-23 DIAGNOSIS — F4310 Post-traumatic stress disorder, unspecified: Secondary | ICD-10-CM

## 2020-06-30 ENCOUNTER — Ambulatory Visit (INDEPENDENT_AMBULATORY_CARE_PROVIDER_SITE_OTHER): Payer: 59 | Admitting: Psychology

## 2020-06-30 DIAGNOSIS — F4310 Post-traumatic stress disorder, unspecified: Secondary | ICD-10-CM

## 2020-07-01 NOTE — Telephone Encounter (Signed)
Can you have a paraguard ready for this patient?

## 2020-07-07 ENCOUNTER — Ambulatory Visit (INDEPENDENT_AMBULATORY_CARE_PROVIDER_SITE_OTHER): Payer: 59 | Admitting: Psychology

## 2020-07-07 DIAGNOSIS — F4310 Post-traumatic stress disorder, unspecified: Secondary | ICD-10-CM

## 2020-07-14 ENCOUNTER — Ambulatory Visit (INDEPENDENT_AMBULATORY_CARE_PROVIDER_SITE_OTHER): Payer: 59 | Admitting: Psychology

## 2020-07-14 DIAGNOSIS — F4310 Post-traumatic stress disorder, unspecified: Secondary | ICD-10-CM | POA: Diagnosis not present

## 2020-07-15 IMAGING — US US PELVIS COMPLETE TRANSABD/TRANSVAG
1 series · 13 of 25 positions shown · non-contrast
Comparison: None

ADDENDUM:
Outside CT dated 01/25/2018 was compared with current exam. Overall
findings on current exam remain unchanged. In comparison with the
CT, the cystic structure appears similar however is favored to
represent a complicated cyst, potentially paraovarian or paratubal
given the apparent location on transabdominal imaging.
Recommendation is unchanged for follow-up pelvic ultrasound in 6-8
weeks for reassessment.
CLINICAL DATA: Patient with history of left adnexal cyst.

EXAM:
TRANSABDOMINAL AND TRANSVAGINAL ULTRASOUND OF PELVIS
TECHNIQUE: Both transabdominal and transvaginal ultrasound examinations of the
pelvis were performed. Transabdominal technique was performed for
global imaging of the pelvis including uterus, ovaries, adnexal
regions, and pelvic cul-de-sac. It was necessary to proceed with
endovaginal exam following the transabdominal exam to visualize the
adnexal structures.

[Series 1: us pelvis complete transabd/transvag · 0.26mm/px · 13 of 93 slices shown]
[im 1/93]
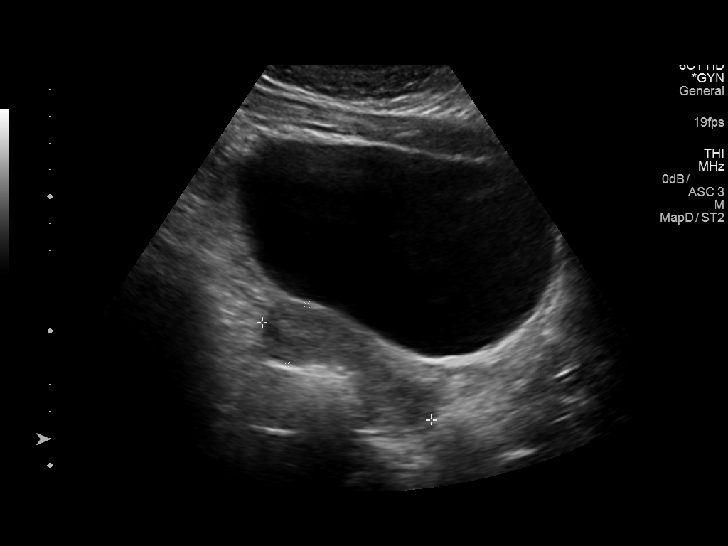
[im 8/93]
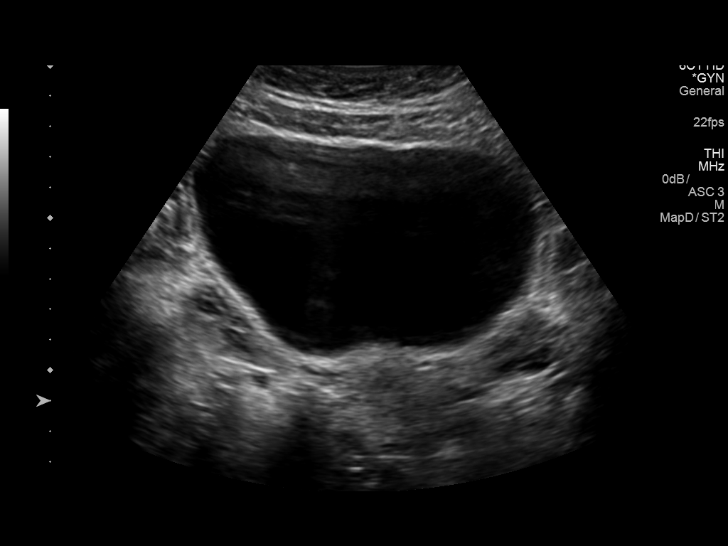
[im 16/93]
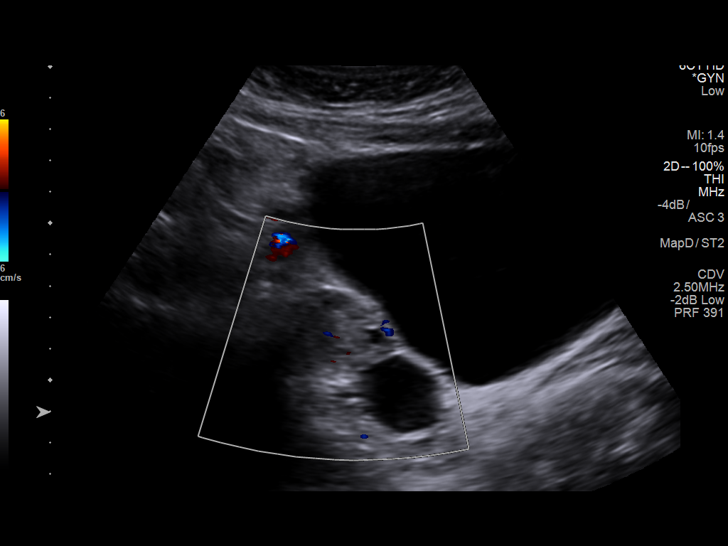
[im 24/93]
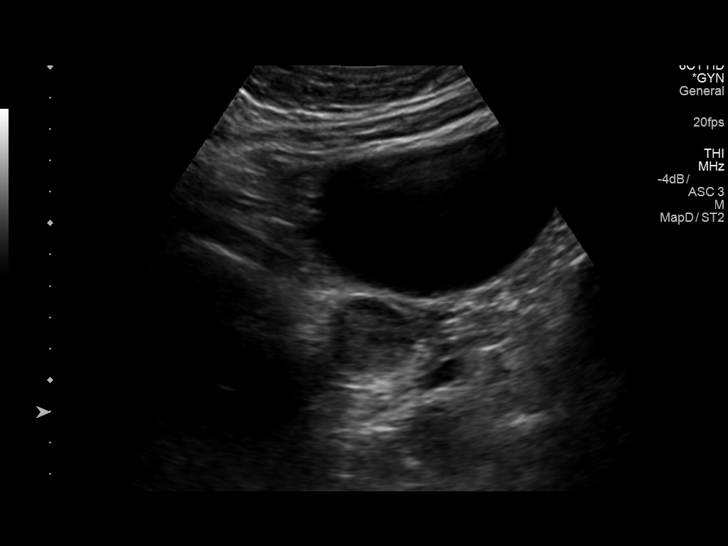
[im 31/93]
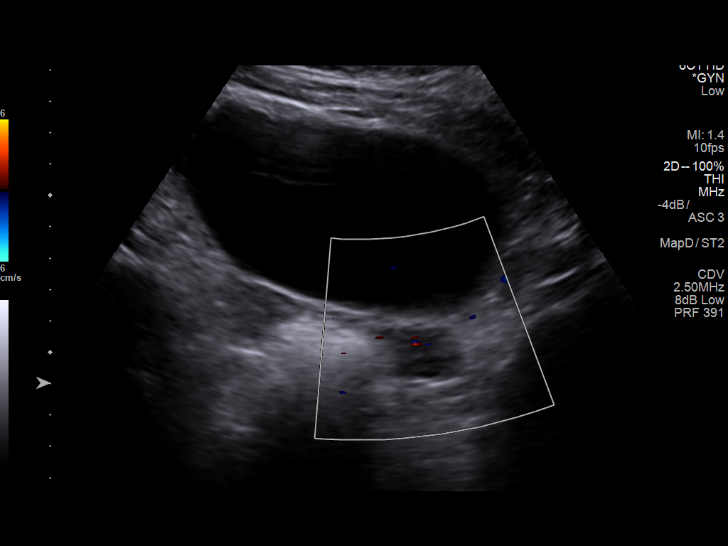
[im 39/93]
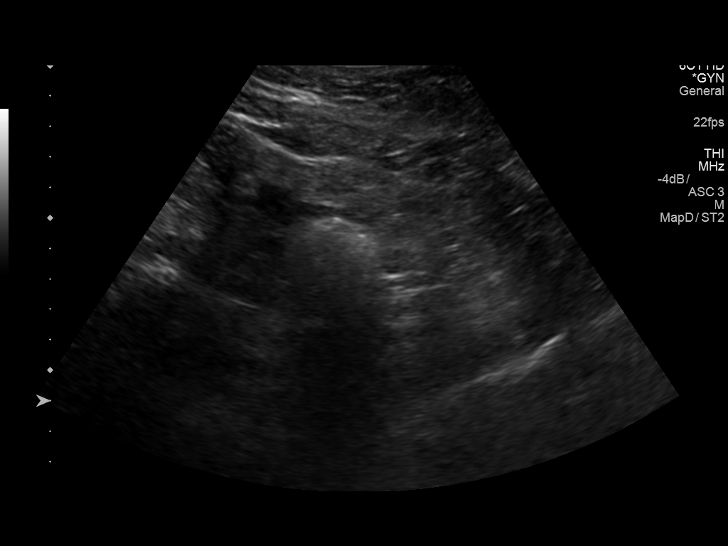
[im 47/93]
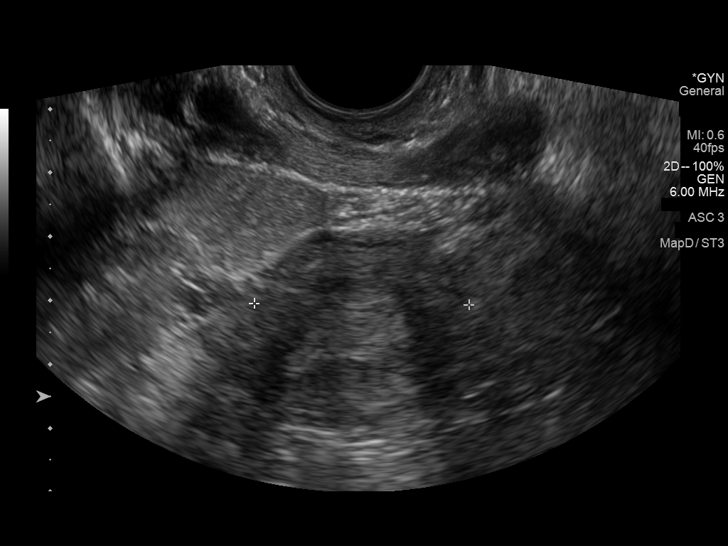
[im 54/93]
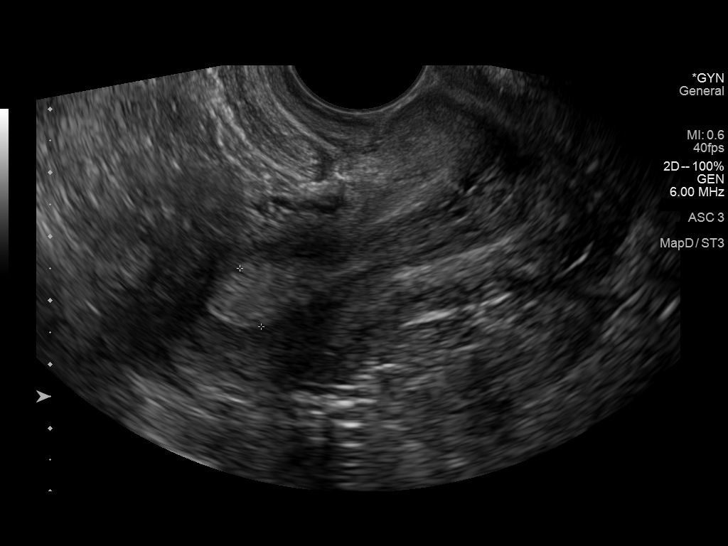
[im 62/93]
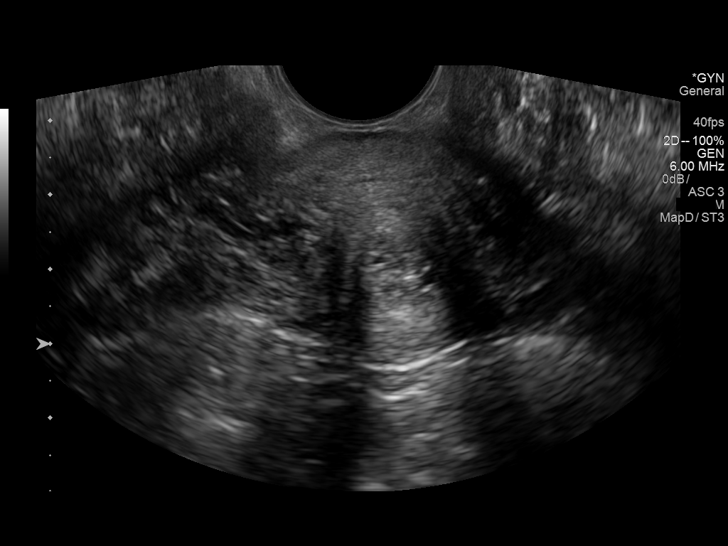
[im 70/93]
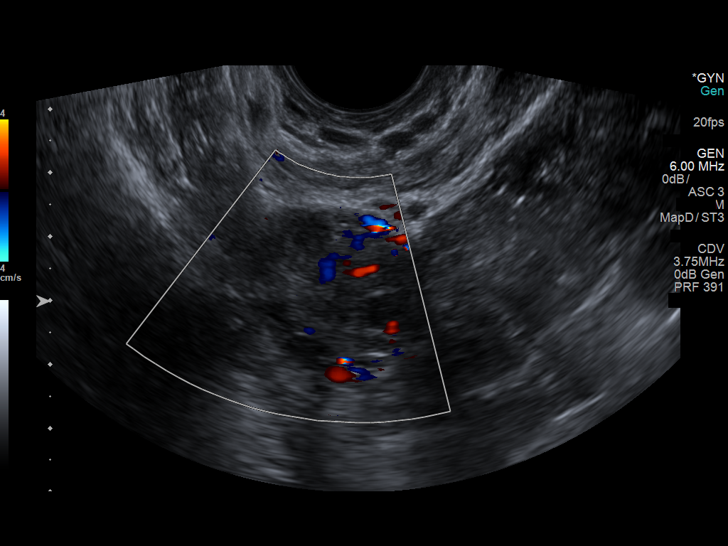
[im 77/93]
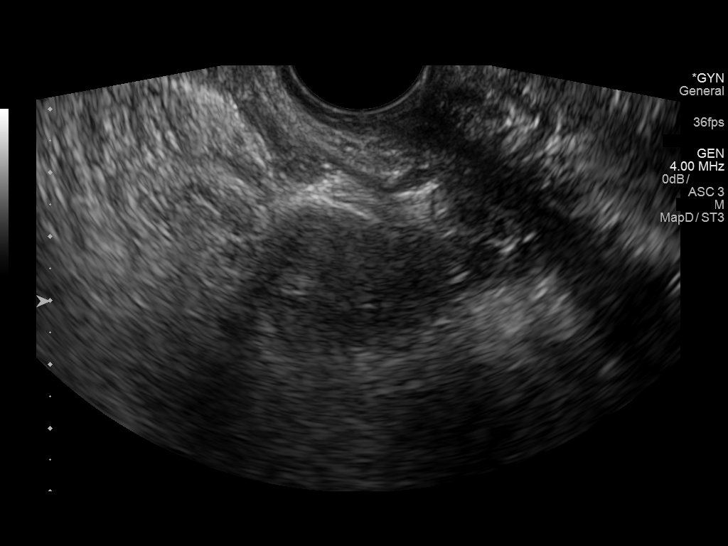
[im 85/93]
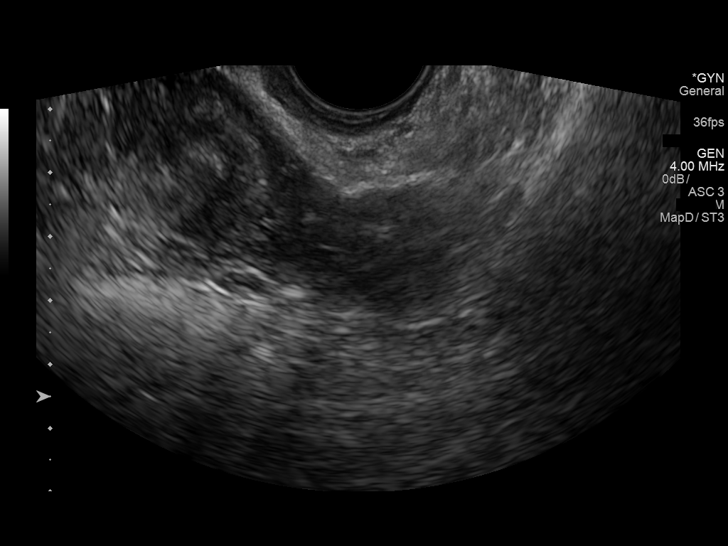
[im 93/93]
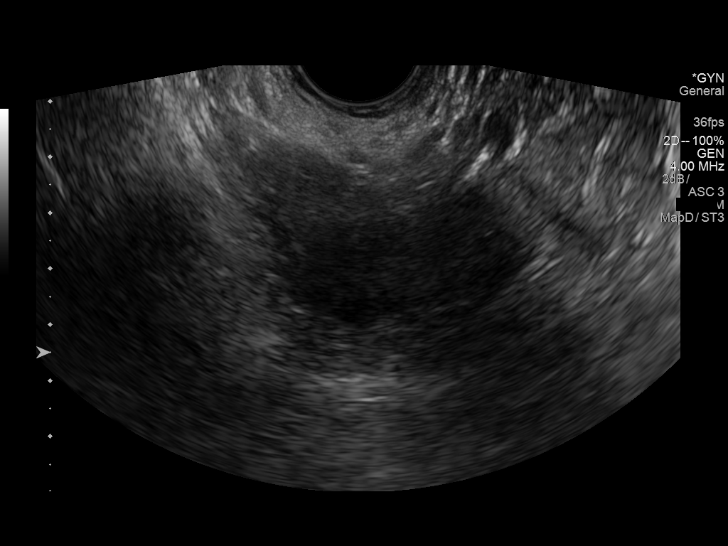

[13 of 25 positions shown; findings below may reference images not displayed]

FINDINGS: Uterus

Measurements: 7.4 x 3.2 x 3.4 cm. No fibroids or other mass
visualized.

Endometrium

Thickness: 10 mm.  No focal abnormality visualized.

Right ovary

Measurements: 2.4 x 2.4 x 2.4 cm. Normal appearance/no adnexal mass.
There is a 2.3 x 2.3 x 2.6 cm complex cystic structure adjacent to
the right ovary.

Left ovary

Measurements: 3.2 x 2.3 x 2.3 cm. Normal appearance/no adnexal mass.

Other findings

No abnormal free fluid.
IMPRESSION: There is a 2.6 cm complex cystic structure within the right adnexa
which appears adjacent to the right ovary, potentially representing
a complex paraovarian or paratubal cyst. Possibility of endometrioma
or other adnexal etiologies not excluded. Recommend follow-up pelvic
ultrasound in 6-8 weeks.

## 2020-07-20 ENCOUNTER — Ambulatory Visit (INDEPENDENT_AMBULATORY_CARE_PROVIDER_SITE_OTHER): Payer: 59 | Admitting: Psychology

## 2020-07-20 DIAGNOSIS — F4310 Post-traumatic stress disorder, unspecified: Secondary | ICD-10-CM

## 2020-07-23 ENCOUNTER — Ambulatory Visit (INDEPENDENT_AMBULATORY_CARE_PROVIDER_SITE_OTHER): Payer: Managed Care, Other (non HMO) | Admitting: Obstetrics and Gynecology

## 2020-07-23 ENCOUNTER — Encounter: Payer: Self-pay | Admitting: Obstetrics and Gynecology

## 2020-07-23 ENCOUNTER — Other Ambulatory Visit (HOSPITAL_COMMUNITY)
Admission: RE | Admit: 2020-07-23 | Discharge: 2020-07-23 | Disposition: A | Discharge status: Home / Self Care | Payer: Managed Care, Other (non HMO) | Source: Ambulatory Visit | Attending: Obstetrics and Gynecology | Admitting: Obstetrics and Gynecology

## 2020-07-23 ENCOUNTER — Other Ambulatory Visit: Payer: Self-pay

## 2020-07-23 VITALS — BP 114/70 | Ht 68.0 in | Wt 203.6 lb

## 2020-07-23 DIAGNOSIS — Z0000 Encounter for general adult medical examination without abnormal findings: Secondary | ICD-10-CM | POA: Diagnosis not present

## 2020-07-23 DIAGNOSIS — Z1239 Encounter for other screening for malignant neoplasm of breast: Secondary | ICD-10-CM

## 2020-07-23 DIAGNOSIS — Z113 Encounter for screening for infections with a predominantly sexual mode of transmission: Secondary | ICD-10-CM

## 2020-07-23 DIAGNOSIS — N83202 Unspecified ovarian cyst, left side: Secondary | ICD-10-CM

## 2020-07-23 DIAGNOSIS — Z87820 Personal history of traumatic brain injury: Secondary | ICD-10-CM

## 2020-07-23 DIAGNOSIS — Z01419 Encounter for gynecological examination (general) (routine) without abnormal findings: Secondary | ICD-10-CM | POA: Diagnosis not present

## 2020-07-23 DIAGNOSIS — N83201 Unspecified ovarian cyst, right side: Secondary | ICD-10-CM

## 2020-07-23 NOTE — Progress Notes (Signed)
Gynecology Annual Exam  PCP: Lesleigh Noe, MD  Chief Complaint:  Chief Complaint  Patient presents with  . Gynecologic Exam    Pt dose not want her IUD placed today.    History of Present Illness: Patient is a 25 y.o. No obstetric history on file. presents for annual exam. The patient has no complaints today.   LMP: Patient's last menstrual period was 06/16/2020. Average Interval: regular, monthly Reports that she underwent a traumatic brain injury in 2019. After this incident she did not have her menstrual cycle, but for the last several months it has been returning.   This subsequent evaluation after her TBI showed that she had complex pelvic cystic structures, possible paraovarian, paratubal cyst, or endometrioma. Patient denies a history of pelvic pain, painful periods or pain with intercourse.   Duration of flow: 5 days Heavy Menses: no Intermenstrual Bleeding: no Dysmenorrhea: no  The patient is sexually active. She denies dyspareunia.  Postcoital Bleeding: no.  She currently uses condoms for contraception.    The patient does perform self breast exams.  There is no notable family history of breast or ovarian cancer in her family.  The patient denies current symptoms of depression.   Review of Systems: Review of Systems  Constitutional: Negative for chills, fever, malaise/fatigue and weight loss.  HENT: Negative for congestion, hearing loss and sinus pain.   Eyes: Negative for blurred vision and double vision.  Respiratory: Negative for cough, sputum production, shortness of breath and wheezing.   Cardiovascular: Negative for chest pain, palpitations, orthopnea and leg swelling.  Gastrointestinal: Negative for abdominal pain, constipation, diarrhea, nausea and vomiting.  Genitourinary: Negative for dysuria, flank pain, frequency, hematuria and urgency.  Musculoskeletal: Negative for back pain, falls and joint pain.  Skin: Negative for itching and rash.   Neurological: Negative for dizziness and headaches.  Psychiatric/Behavioral: Negative for depression, substance abuse and suicidal ideas. The patient is not nervous/anxious.     Past Medical History:  Past Medical History:  Diagnosis Date  . TBI (traumatic brain injury) [facility])     Past Surgical History:  Past Surgical History:  Procedure Laterality Date  . External Fixator in Left arm  2019  . HIP ARTHROSCOPY Right 2020   at [facility]  . SPINAL FUSION  2019  . TONSILLECTOMY      Gynecologic History:  Patient's last menstrual period was 06/16/2020. Contraception: condoms Last Pap: Results were: 2019 NIL   Obstetric History: No obstetric history on file.  Family History:  Family History  Problem Relation Age of Onset  . Hyperlipidemia Father   . Hypertension Father   . Gout Father   . Gout Paternal Grandmother   . Hyperlipidemia Paternal Grandfather   . Heart disease Paternal Grandfather        triple bypass    Social History:  Social History   Socioeconomic History  . Marital status: Married    Spouse name: [name]  . Number of children: 0  . Years of education: PhD currently  . Highest education level: Not on file  Occupational History  . Not on file  Tobacco Use  . Smoking status: Never Smoker  . Smokeless tobacco: Never Used  Vaping Use  . Vaping Use: Never used  Substance and Sexual Activity  . Alcohol use: Not Currently    Comment: a couple times a year  . Drug use: Never  . Sexual activity: Yes    Birth control/protection: Condom  Other Topics Concern  .  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 [place]: 3 parrots, and 1 dog      Family: Mom in [place] and Dad in [place]       Enjoys: spending time with pets      Exercise: walking the dog, nintendo switch fit adventure, still doing rehab exercises    Diet: eats breakfast in the AM, frozen food for lunch, home cooking      Safety   Seat belts: Yes    Guns: Yes  and and secure   Safe in relationships: Yes    Social Determinants of Radio broadcast assistant Strain: Not on file  Food Insecurity: Not on file  Transportation Needs: Not on file  Physical Activity: Not on file  Stress: Not on file  Social Connections: Not on file  Intimate Partner Violence: Not on file    Allergies:  Allergies  Allergen Reactions  . Pineapple Rash    Medications: Prior to Admission medications   Medication Sig Start Date End Date Taking? Authorizing Provider  escitalopram (LEXAPRO) 10 MG tablet TAKE 1 TABLET BY MOUTH EVERY DAY 03/31/20  Yes Lesleigh Noe, MD  ALPRAZolam (XANAX XR) 0.5 MG 24 hr tablet Take 1 tablet (0.5 mg total) by mouth as needed for anxiety. Take 0.5-2 tablets as needed Patient not taking: Reported on 07/23/2020 03/31/20   Lesleigh Noe, MD    Physical Exam Vitals: Blood pressure 114/70, height 5\' 8"  (1.727 m), weight 203 lb 9.6 oz (92.4 kg), last menstrual period 06/16/2020.  Physical Exam Constitutional:      Appearance: She is well-developed.  Genitourinary:     Vagina and uterus normal.     There is no lesion on the right labia.     There is no lesion on the left labia.    No lesions in the vagina.     Genitourinary Comments: External: Normal appearing vulva. No lesions noted.  Speculum examination: Normal appearing cervix. No blood in the vaginal vault. No discharge.   Bimanual examination: Uterus midline, non-tender, normal in size, shape and contour.  No CMT. No adnexal masses. No adnexal tenderness. Pelvis not fixed.       Right Adnexa: no mass present.    Left Adnexa: no mass present.    No cervical motion tenderness.  Breasts:     Right: No inverted nipple, mass, nipple discharge or skin change.     Left: No inverted nipple, mass, nipple discharge or skin change.    HENT:     Head: Normocephalic and  atraumatic.  Eyes:     Extraocular Movements: EOM normal.  Neck:     Thyroid: No thyromegaly.  Cardiovascular:     Rate and Rhythm: Normal rate and regular rhythm.     Heart sounds: Normal heart sounds.  Pulmonary:     Effort: Pulmonary effort is normal.     Breath sounds: Normal breath sounds.  Abdominal:     General: Bowel sounds are normal. There is no distension.     Palpations: Abdomen is soft. There is no mass.  Musculoskeletal:     Cervical back: Neck supple.  Neurological:     Mental Status: She is alert and oriented to person, place, and time.  Skin:    General: Skin is warm and dry.  Psychiatric:  Mood and Affect: Mood and affect normal.        Behavior: Behavior normal.        Thought Content: Thought content normal.        Judgment: Judgment normal.  Vitals reviewed.      Female chaperone present for pelvic and breast  portions of the physical exam  Assessment: 25 y.o. No obstetric history on file. routine annual exam  Plan: Problem List Items Addressed This Visit      Endocrine   Ovarian cyst   Relevant Orders   US PELVIS TRANSVAGINAL NON-OB (TV ONLY)     Nervous and Auditory   History of traumatic brain injury   Relevant Orders   Ambulatory referral to Anesthesiology    Other Visit Diagnoses    Encounter for annual routine gynecological examination    -  Primary   Health maintenance examination       Encounter for gynecological examination without abnormal finding       Encounter for screening breast examination       Screen for STD (sexually transmitted disease)       Relevant Orders   Cervicovaginal ancillary only      1) STI screening was offered and accepted  2) ASCCP guidelines and rational discussed.  Patient opts for every 3 years screening interval  3) Contraception - Education given regarding options for contraception, including IUD placement, condoms. She desires to use condoms.  4)  Routine healthcare maintenance including  cholesterol, diabetes screening discussed managed by PCP  5) Questions regarding anesthesia with history of traumatic brain injury and spinal fusion surgery at [facility]- will refer for consultation to anesthesia to discuss.   6) History of ovarian and paratubal cysts, will repeat US. If cysts are stable no further evaluation or intervention needed.   Adrian Prows MD, [facility], [facility] 07/23/2020 7:50 PM

## 2020-07-23 NOTE — Patient Instructions (Signed)
Institute of Medicine Recommended Dietary Allowances for Calcium and Vitamin D  Age (yr) Calcium Recommended Dietary Allowance (mg/day) Vitamin D Recommended Dietary Allowance (international units/day)  9-18 1,300 600  19-50 1,000 600  51-70 1,200 600  71 and older 1,200 800  Data from Institute of Medicine. Dietary reference intakes: calcium, vitamin D. [place], [place]: National Academies Press; 2011.     Exercising to Stay Healthy To become healthy and stay healthy, it is recommended that you do moderate-intensity and vigorous-intensity exercise. You can tell that you are exercising at a moderate intensity if your heart starts beating faster and you start breathing faster but can still hold a conversation. You can tell that you are exercising at a vigorous intensity if you are breathing much harder and faster and cannot hold a conversation while exercising. Exercising regularly is important. It has many health benefits, such as:  Improving overall fitness, flexibility, and endurance.  Increasing bone density.  Helping with weight control.  Decreasing body fat.  Increasing muscle strength.  Reducing stress and tension.  Improving overall health. How often should I exercise? Choose an activity that you enjoy, and set realistic goals. Your health care provider can help you make an activity plan that works for you. Exercise regularly as told by your health care provider. This may include:  Doing strength training two times a week, such as: ? Lifting weights. ? Using resistance bands. ? Push-ups. ? Sit-ups. ? Yoga.  Doing a certain intensity of exercise for a given amount of time. Choose from these options: ? A total of 150 minutes of moderate-intensity exercise every week. ? A total of 75 minutes of vigorous-intensity exercise every week. ? A mix of moderate-intensity and vigorous-intensity exercise every week. Children, pregnant women, people who have not exercised  regularly, people who are overweight, and older adults may need to talk with a health care provider about what activities are safe to do. If you have a medical condition, be sure to talk with your health care provider before you start a new exercise program. What are some exercise ideas? Moderate-intensity exercise ideas include:  Walking 1 mile (1.6 km) in about 15 minutes.  Biking.  Hiking.  Golfing.  Dancing.  Water aerobics. Vigorous-intensity exercise ideas include:  Walking 4.5 miles (7.2 km) or more in about 1 hour.  Jogging or running 5 miles (8 km) in about 1 hour.  Biking 10 miles (16.1 km) or more in about 1 hour.  Lap swimming.  Roller-skating or in-line skating.  Cross-country skiing.  Vigorous competitive sports, such as football, basketball, and soccer.  Jumping rope.  Aerobic dancing. What are some everyday activities that can help me to get exercise?  Yard work, such as: ? Pushing a lawn mower. ? Raking and bagging leaves.  Washing your car.  Pushing a stroller.  Shoveling snow.  Gardening.  Washing windows or floors. How can I be more active in my day-to-day activities?  Use stairs instead of an elevator.  Take a walk during your lunch break.  If you drive, park your car farther away from your work or school.  If you take public transportation, get off one stop early and walk the rest of the way.  Stand up or walk around during all of your indoor phone calls.  Get up, stretch, and walk around every 30 minutes throughout the day.  Enjoy exercise with a friend. Support to continue exercising will help you keep a regular routine of activity. What guidelines can   I follow while exercising?  Before you start a new exercise program, talk with your health care provider.  Do not exercise so much that you hurt yourself, feel dizzy, or get very short of breath.  Wear comfortable clothes and wear shoes with good support.  Drink plenty of  water while you exercise to prevent dehydration or heat stroke.  Work out until your breathing and your heartbeat get faster. Where to find more information  U.S. Department of Health and Human Services: www.hhs.gov  Centers for Disease Control and Prevention (CDC): www.cdc.gov Summary  Exercising regularly is important. It will improve your overall fitness, flexibility, and endurance.  Regular exercise also will improve your overall health. It can help you control your weight, reduce stress, and improve your bone density.  Do not exercise so much that you hurt yourself, feel dizzy, or get very short of breath.  Before you start a new exercise program, talk with your health care provider. This information is not intended to replace advice given to you by your health care provider. Make sure you discuss any questions you have with your health care provider. Document Revised: 06/15/2017 Document Reviewed: 05/24/2017 Elsevier Patient Education  2020 Elsevier Inc.   Budget-Friendly Healthy Eating There are many ways to save money at the grocery store and continue to eat healthy. You can be successful if you:  Plan meals according to your budget.  Make a grocery list and only purchase food according to your grocery list.  Prepare food yourself. What are tips for following this plan?  Reading food labels  Compare food labels between brand name foods and the store brand. Often the nutritional value is the same, but the store brand is lower cost.  Look for products that do not have added sugar, fat, or salt (sodium). These often cost the same but are healthier for you. Products may be labeled as: ? Sugar-free. ? Nonfat. ? Low-fat. ? Sodium-free. ? Low-sodium.  Look for lean ground beef labeled as at least 92% lean and 8% fat. Shopping  Buy only the items on your grocery list and go only to the areas of the store that have the items on your list.  Use coupons only for foods  and brands you normally buy. Avoid buying items you wouldn't normally buy simply because they are on sale.  Check online and in newspapers for weekly deals.  Buy healthy items from the bulk bins when available, such as herbs, spices, flour, pasta, nuts, and dried fruit.  Buy fruits and vegetables that are in season. Prices are usually lower on in-season produce.  Look at the unit price on the price tag. Use it to compare different brands and sizes to find out which item is the best deal.  Choose healthy items that are often low-cost, such as carrots, potatoes, apples, bananas, and oranges. Dried or canned beans are a low-cost protein source.  Buy in bulk and freeze extra food. Items you can buy in bulk include meats, fish, poultry, frozen fruits, and frozen vegetables.  Avoid buying "ready-to-eat" foods, such as pre-cut fruits and vegetables and pre-made salads.  If possible, shop around to discover where you can find the best prices. Consider other retailers such as dollar stores, larger wholesale stores, local fruit and vegetable stands, and farmers markets.  Do not shop when you are hungry. If you shop while hungry, it may be hard to stick to your list and budget.  Resist impulse buying. Use your grocery list as   your official plan for the week.  Buy a variety of vegetables and fruits by purchasing fresh, frozen, and canned items.  Look at the top and bottom shelves for deals. Foods at eye level (eye level of an adult or child) are usually more expensive.  Be efficient with your time when shopping. The more time you spend at the store, the more money you are likely to spend.  To save money when choosing more expensive foods like meats and dairy: ? Choose cheaper cuts of meat, such as bone-in chicken thighs and drumsticks instead of skinless and boneless chicken. When you are ready to prepare the chicken, you can remove the skin yourself to make it healthier. ? Choose lean meats like  chicken or turkey instead of beef. ? Choose canned seafood, such as tuna, salmon, or sardines. ? Buy eggs as a low-cost source of protein. ? Buy dried beans and peas, such as lentils, split peas, or kidney beans instead of meats. Dried beans and peas are a good alternative source of protein. ? Buy the larger tubs of yogurt instead of individual-sized containers.  Choose water instead of sodas and other sweetened beverages.  Avoid buying chips, cookies, and other "junk food." These items are usually expensive and not healthy. Cooking  Make extra food and freeze the extras in meal-sized containers or in individual portions for fast meals and snacks.  Pre-cook on days when you have extra time to prepare meals in advance. You can keep these meals in the fridge or freezer and reheat for a quick meal.  When you come home from the grocery store, wash, peel, and cut fruits and vegetables so they are ready to use and eat. This will help reduce food waste. Meal planning  Do not eat out or get fast food. Prepare food at home.  Make a grocery list and make sure to bring it with you to the store. If you have a smart phone, you could use your phone to create your shopping list.  Plan meals and snacks according to a grocery list and budget you create.  Use leftovers in your meal plan for the week.  Look for recipes where you can cook once and make enough food for two meals.  Include budget-friendly meals like stews, casseroles, and stir-fry dishes.  Try some meatless meals or try "no cook" meals like salads.  Make sure that half your plate is filled with fruits or vegetables. Choose from fresh, frozen, or canned fruits and vegetables. If eating canned, remember to rinse them before eating. This will remove any excess salt added for packaging. Summary  Eating healthy on a budget is possible if you plan your meals according to your budget, purchase according to your budget and grocery list, and  prepare food yourself.  Tips for buying more food on a limited budget include buying generic brands, using coupons only for foods you normally buy, and buying healthy items from the bulk bins when available.  Tips for buying cheaper food to replace expensive food include choosing cheaper, lean cuts of meat, and buying dried beans and peas. This information is not intended to replace advice given to you by your health care provider. Make sure you discuss any questions you have with your health care provider. Document Revised: 07/04/2017 Document Reviewed: 07/04/2017 Elsevier Patient Education  2020 Elsevier Inc.   Bone Health Bones protect organs, store calcium, anchor muscles, and support the whole body. Keeping your bones strong is important, especially as you   get older. You can take actions to help keep your bones strong and healthy. Why is keeping my bones healthy important?  Keeping your bones healthy is important because your body constantly replaces bone cells. Cells get old, and new cells take their place. As we age, we lose bone cells because the body may not be able to make enough new cells to replace the old cells. The amount of bone cells and bone tissue you have is referred to as bone mass. The higher your bone mass, the stronger your bones. The aging process leads to an overall loss of bone mass in the body, which can increase the likelihood of:  Joint pain and stiffness.  Broken bones.  A condition in which the bones become weak and brittle (osteoporosis). A large decline in bone mass occurs in older adults. In women, it occurs about the time of menopause. What actions can I take to keep my bones healthy? Good health habits are important for maintaining healthy bones. This includes eating nutritious foods and exercising regularly. To have healthy bones, you need to get enough of the right minerals and vitamins. Most nutrition experts recommend getting these nutrients from the  foods that you eat. In some cases, taking supplements may also be recommended. Doing certain types of exercise is also important for bone health. What are the nutritional recommendations for healthy bones?  Eating a well-balanced diet with plenty of calcium and vitamin D will help to protect your bones. Nutritional recommendations vary from person to person. Ask your health care provider what is healthy for you. Here are some general guidelines. Get enough calcium Calcium is the most important (essential) mineral for bone health. Most people can get enough calcium from their diet, but supplements may be recommended for people who are at risk for osteoporosis. Good sources of calcium include:  Dairy products, such as low-fat or nonfat milk, cheese, and yogurt.  Dark green leafy vegetables, such as bok choy and broccoli.  Calcium-fortified foods, such as orange juice, cereal, bread, soy beverages, and tofu products.  Nuts, such as almonds. Follow these recommended amounts for daily calcium intake:  Children, age 1-3: 700 mg.  Children, age 4-8: 1,000 mg.  Children, age 9-13: 1,300 mg.  Teens, age 14-18: 1,300 mg.  Adults, age 19-50: 1,000 mg.  Adults, age 51-70: ? Men: 1,000 mg. ? Women: 1,200 mg.  Adults, age 71 or older: 1,200 mg.  Pregnant and breastfeeding females: ? Teens: 1,300 mg. ? Adults: 1,000 mg. Get enough vitamin D Vitamin D is the most essential vitamin for bone health. It helps the body absorb calcium. Sunlight stimulates the skin to make vitamin D, so be sure to get enough sunlight. If you live in a cold climate or you do not get outside often, your health care provider may recommend that you take vitamin D supplements. Good sources of vitamin D in your diet include:  Egg yolks.  Saltwater fish.  Milk and cereal fortified with vitamin D. Follow these recommended amounts for daily vitamin D intake:  Children and teens, age 1-18: 600 international  units.  Adults, age 50 or younger: 400-800 international units.  Adults, age 51 or older: 800-1,000 international units. Get other important nutrients Other nutrients that are important for bone health include:  Phosphorus. This mineral is found in meat, poultry, dairy foods, nuts, and legumes. The recommended daily intake for adult men and adult women is 700 mg.  Magnesium. This mineral is found in seeds, nuts, dark   green vegetables, and legumes. The recommended daily intake for adult men is 400-420 mg. For adult women, it is 310-320 mg.  Vitamin K. This vitamin is found in green leafy vegetables. The recommended daily intake is 120 mg for adult men and 90 mg for adult women. What type of physical activity is best for building and maintaining healthy bones? Weight-bearing and strength-building activities are important for building and maintaining healthy bones. Weight-bearing activities cause muscles and bones to work against gravity. Strength-building activities increase the strength of the muscles that support bones. Weight-bearing and muscle-building activities include:  Walking and hiking.  Jogging and running.  Dancing.  Gym exercises.  Lifting weights.  Tennis and racquetball.  Climbing stairs.  Aerobics. Adults should get at least 30 minutes of moderate physical activity on most days. Children should get at least 60 minutes of moderate physical activity on most days. Ask your health care provider what type of exercise is best for you. How can I find out if my bone mass is low? Bone mass can be measured with an X-ray test called a bone mineral density (BMD) test. This test is recommended for all women who are age 65 or older. It may also be recommended for:  Men who are age 70 or older.  People who are at risk for osteoporosis because of: ? Having bones that break easily. ? Having a long-term disease that weakens bones, such as kidney disease or rheumatoid  arthritis. ? Having menopause earlier than normal. ? Taking medicine that weakens bones, such as steroids, thyroid hormones, or hormone treatment for breast cancer or prostate cancer. ? Smoking. ? Drinking three or more alcoholic drinks a day. If you find that you have a low bone mass, you may be able to prevent osteoporosis or further bone loss by changing your diet and lifestyle. Where can I find more information? For more information, check out the following websites:  National Osteoporosis Foundation: www.nof.org/patients  National Institutes of Health: www.bones.nih.gov  International Osteoporosis Foundation: www.iofbonehealth.org Summary  The aging process leads to an overall loss of bone mass in the body, which can increase the likelihood of broken bones and osteoporosis.  Eating a well-balanced diet with plenty of calcium and vitamin D will help to protect your bones.  Weight-bearing and strength-building activities are also important for building and maintaining strong bones.  Bone mass can be measured with an X-ray test called a bone mineral density (BMD) test. This information is not intended to replace advice given to you by your health care provider. Make sure you discuss any questions you have with your health care provider. Document Revised: 07/30/2017 Document Reviewed: 07/30/2017 Elsevier Patient Education  2020 Elsevier Inc.   

## 2020-07-26 LAB — CERVICOVAGINAL ANCILLARY ONLY
Chlamydia: NEGATIVE
Comment: NEGATIVE
Comment: NEGATIVE
Comment: NORMAL
Neisseria Gonorrhea: NEGATIVE
Trichomonas: NEGATIVE

## 2020-07-28 ENCOUNTER — Ambulatory Visit (INDEPENDENT_AMBULATORY_CARE_PROVIDER_SITE_OTHER): Payer: 59 | Admitting: Psychology

## 2020-07-28 DIAGNOSIS — F4310 Post-traumatic stress disorder, unspecified: Secondary | ICD-10-CM

## 2020-08-02 ENCOUNTER — Ambulatory Visit: Payer: Managed Care, Other (non HMO)

## 2020-08-02 ENCOUNTER — Ambulatory Visit: Payer: Self-pay | Admitting: Obstetrics and Gynecology

## 2020-08-02 ENCOUNTER — Ambulatory Visit: Payer: Managed Care, Other (non HMO) | Admitting: Obstetrics and Gynecology

## 2020-08-02 ENCOUNTER — Ambulatory Visit: Payer: Self-pay

## 2020-08-02 DIAGNOSIS — N83201 Unspecified ovarian cyst, right side: Secondary | ICD-10-CM

## 2020-08-04 ENCOUNTER — Ambulatory Visit (INDEPENDENT_AMBULATORY_CARE_PROVIDER_SITE_OTHER): Payer: 59 | Admitting: Psychology

## 2020-08-04 DIAGNOSIS — F4310 Post-traumatic stress disorder, unspecified: Secondary | ICD-10-CM | POA: Diagnosis not present

## 2020-08-11 ENCOUNTER — Other Ambulatory Visit: Payer: Self-pay

## 2020-08-11 ENCOUNTER — Ambulatory Visit (INDEPENDENT_AMBULATORY_CARE_PROVIDER_SITE_OTHER): Payer: Managed Care, Other (non HMO) | Admitting: Obstetrics and Gynecology

## 2020-08-11 ENCOUNTER — Ambulatory Visit (INDEPENDENT_AMBULATORY_CARE_PROVIDER_SITE_OTHER): Payer: Managed Care, Other (non HMO)

## 2020-08-11 ENCOUNTER — Ambulatory Visit (INDEPENDENT_AMBULATORY_CARE_PROVIDER_SITE_OTHER): Payer: 59 | Admitting: Psychology

## 2020-08-11 ENCOUNTER — Encounter: Payer: Self-pay | Admitting: Obstetrics and Gynecology

## 2020-08-11 ENCOUNTER — Other Ambulatory Visit: Payer: Managed Care, Other (non HMO)

## 2020-08-11 VITALS — BP 110/70 | Ht 68.0 in | Wt 204.4 lb

## 2020-08-11 DIAGNOSIS — N83201 Unspecified ovarian cyst, right side: Secondary | ICD-10-CM

## 2020-08-11 DIAGNOSIS — N83202 Unspecified ovarian cyst, left side: Secondary | ICD-10-CM | POA: Diagnosis not present

## 2020-08-11 DIAGNOSIS — F4310 Post-traumatic stress disorder, unspecified: Secondary | ICD-10-CM | POA: Diagnosis not present

## 2020-08-11 DIAGNOSIS — N958 Other specified menopausal and perimenopausal disorders: Secondary | ICD-10-CM

## 2020-08-11 DIAGNOSIS — N9489 Other specified conditions associated with female genital organs and menstrual cycle: Secondary | ICD-10-CM

## 2020-08-11 NOTE — Progress Notes (Signed)
US results

## 2020-08-11 NOTE — Progress Notes (Signed)
Patient ID: Monica Forbes, female   DOB: Feb 23, 1996, 25 y.o.   MRN: [id number]  Reason for Consult: Gynecologic Exam   Referred by Lesleigh Noe, MD  Subjective:     HPI:  Monica Forbes is a 25 y.o. female. She is following up for a pelvic US to monitor a previously seen adnexal cyst.     Past Medical History:  Diagnosis Date  . TBI (traumatic brain injury) ([place])    Family History  Problem Relation Age of Onset  . Hyperlipidemia Father   . Hypertension Father   . Gout Father   . Gout Paternal Grandmother   . Hyperlipidemia Paternal Grandfather   . Heart disease Paternal Grandfather        triple bypass   Past Surgical History:  Procedure Laterality Date  . External Fixator in Left arm  2019  . HIP ARTHROSCOPY Right 2020   at [facility]  . SPINAL FUSION  2019  . TONSILLECTOMY      Short Social History:  Social History   Tobacco Use  . Smoking status: Never Smoker  . Smokeless tobacco: Never Used  Substance Use Topics  . Alcohol use: Not Currently    Comment: a couple times a year    Allergies  Allergen Reactions  . Pineapple Rash    Current Outpatient Medications  Medication Sig Dispense Refill  . ALPRAZolam (XANAX XR) 0.5 MG 24 hr tablet Take 1 tablet (0.5 mg total) by mouth as needed for anxiety. Take 0.5-2 tablets as needed (Patient not taking: Reported on 07/23/2020) 30 tablet 0  . escitalopram (LEXAPRO) 10 MG tablet TAKE 1 TABLET BY MOUTH EVERY DAY 90 tablet 1   No current facility-administered medications for this visit.    Review of Systems  Constitutional: Negative for chills, fatigue, fever and unexpected weight change.  HENT: Negative for trouble swallowing.  Eyes: Negative for loss of vision.  Respiratory: Negative for cough, shortness of breath and wheezing.  Cardiovascular: Negative for chest pain, leg swelling, palpitations and syncope.  GI: Negative for abdominal pain, blood in stool, diarrhea, nausea and vomiting.  GU: Negative for  difficulty urinating, dysuria, frequency and hematuria.  Musculoskeletal: Negative for back pain, leg pain and joint pain.  Skin: Negative for rash.  Neurological: Negative for dizziness, headaches, light-headedness, numbness and seizures.  Psychiatric: Negative for behavioral problem, confusion, depressed mood and sleep disturbance.        Objective:  Objective   Vitals:   08/11/20 1355  BP: 110/70  Weight: 204 lb 6.4 oz (92.7 kg)  Height: 5\' 8"  (1.727 m)   Body mass index is 31.08 kg/m.  Physical Exam Vitals and nursing note reviewed.  Constitutional:      Appearance: She is well-developed and well-nourished.  HENT:     Head: Normocephalic and atraumatic.  Eyes:     Extraocular Movements: EOM normal.     Pupils: Pupils are equal, round, and reactive to light.  Cardiovascular:     Rate and Rhythm: Normal rate and regular rhythm.  Pulmonary:     Effort: Pulmonary effort is normal. No respiratory distress.  Skin:    General: Skin is warm and dry.  Neurological:     Mental Status: She is alert and oriented to person, place, and time.  Psychiatric:        Mood and Affect: Mood and affect normal.        Behavior: Behavior normal.        Thought  Content: Thought content normal.        Judgment: Judgment normal.     Assessment/Plan:     25 yo with right simple adnexal cyst. Right adnexal cysts greater than 5 cm, no resolution, increased in size. Discussed that this could be a paratubal cyst, the fallopian tube itself or an ovary which is enlarged.  She understands that removing the cyst may involve removing the right fallopian tube.  Will plan for robotic assisted laparoscopy and removal of adnexal cyst.   More than 20 minutes were spent face to face with the patient in the room, reviewing the medical record, labs and images, and coordinating care for the patient. The plan of management was discussed in detail and counseling was provided.      Adrian Prows  MD [facility] 08/11/2020 2:22 PM

## 2020-08-11 NOTE — Patient Instructions (Signed)
Diagnostic Laparoscopy Diagnostic laparoscopy is a procedure to diagnose problems in the abdomen. It might be done for a variety of reasons, such as to look for scar tissue, a reason for abdominal pain, an abdominal mass or tumor, or fluid in the abdomen (ascites). This procedure may also be done to remove a tissue sample from the liver to look at under a microscope (biopsy). During the procedure, a thin, flexible tube that has a light and a camera on the end (laparoscope) is inserted through a small incision in the abdomen. The image from the camera is shown on a monitor to help the surgeon see inside the body. Tell a health care provider about:  Any allergies you have.  All medicines you are taking, including vitamins, herbs, eye drops, creams, and over-the-counter medicines.  Any problems you or family members have had with anesthetic medicines.  Any blood disorders you have.  Any surgeries you have had.  Any medical conditions you have.  Whether you are pregnant or may be pregnant. What are the risks? Generally, this is a safe procedure. However, problems may occur, including:  Infection.  Bleeding.  Allergic reactions to medicines or dyes.  Damage to abdominal structures or organs, such as the intestines, liver, stomach, or spleen. What happens before the procedure? Staying hydrated Follow instructions from your health care provider about hydration, which may include:  Up to 2 hours before the procedure - you may continue to drink clear liquids, such as water, clear fruit juice, black coffee, and plain tea.   Eating and drinking restrictions Follow instructions from your health care provider about eating and drinking, which may include:  8 hours before the procedure - stop eating heavy meals or foods, such as meat, fried foods, or fatty foods.  6 hours before the procedure - stop eating light meals or foods, such as toast or cereal.  6 hours before the procedure - stop  drinking milk or drinks that contain milk.  2 hours before the procedure - stop drinking clear liquids. Medicines Ask your health care provider about:  Changing or stopping your regular medicines. This is especially important if you are taking diabetes medicines or blood thinners.  Taking medicines such as aspirin and ibuprofen. These medicines can thin your blood. Do not take these medicines unless your health care provider tells you to take them.  Taking over-the-counter medicines, vitamins, herbs, and supplements. General instructions  Ask your health care provider: ? How your surgery site will be marked. ? What steps will be taken to help prevent infection. These steps may include:  Removing hair at the surgery site.  Washing skin with a germ-killing soap.  Taking antibiotic medicine.  Plan to have a responsible adult take you home from the hospital or clinic.  Plan to have a responsible adult care for you for the time you are told after you leave the hospital or clinic. This is important. What happens during the procedure?  An IV will be inserted into one of your veins.  You will be given one or more of the following: ? A medicine to help you relax (sedative). ? A medicine to numb the area (local anesthetic). ? A medicine to make you fall asleep (general anesthetic).  A breathing tube will be placed down your throat to help you breathe during the procedure.  Your abdomen will be filled with an air-like gas so that your abdomen expands. This will give the surgeon more room to operate and will make  your organs easier to see.  Many small incisions will be made in your abdomen.  A laparoscope and other surgical instruments will be inserted into your abdomen through these incisions.  A biopsy may be done. This will depend on the reason why you are having this procedure.  The laparoscope and other instruments will be removed from your abdomen.  The air-like gas will be  released from your abdomen.  Your incisions will be closed with stitches (sutures), skin glue, or surgical tapes and covered with a bandage (dressing).  Your breathing tube will be removed. The procedure may vary among health care providers and hospitals.   What happens after the procedure?  Your blood pressure, heart rate, breathing rate, and blood oxygen level will be monitored until you leave the hospital or clinic.  If you were given a sedative during the procedure, it can affect you for several hours. Do not drive or operate machinery until your health care provider says that it is safe.  It is up to you to get the results of your procedure. Ask your health care provider, or the department that is doing the procedure, when your results will be ready. Summary  Diagnostic laparoscopy is a procedure to diagnose problems in the abdomen using a thin, flexible tube that has a light and a camera on the end (laparoscope).  Follow instructions from your health care provider about how to prepare for the procedure.  Plan to have a responsible adult care for you for the time you are told after you leave the hospital or clinic. This is important. This information is not intended to replace advice given to you by your health care provider. Make sure you discuss any questions you have with your health care provider. Document Revised: 02/27/2020 Document Reviewed: 02/27/2020 Elsevier Patient Education  2021 [employer].

## 2020-08-18 ENCOUNTER — Ambulatory Visit: Payer: 59 | Admitting: Psychology

## 2020-08-18 ENCOUNTER — Telehealth: Payer: Self-pay

## 2020-08-18 NOTE — Telephone Encounter (Signed)
-----   Message from Homero Fellers, MD sent at 08/11/2020  2:16 PM EST ----- Surgery Booking Request Patient Full Name:  JAMARIS BIERNAT  MRN: [id number]  DOB: 04-30-1996  Surgeon: Homero Fellers, MD  Requested Surgery Date and Time: February or March 2022 Primary Diagnosis AND Code: Left adnexal cyst Secondary Diagnosis and Code:  Surgical Procedure: Robotic Diagnostic laparoscopy and cystectomy RNFA Requested?: No L&D Notification: No Admission Status: same day surgery Length of Surgery: 75 min Special Case Needs: No H&P: Yes Phone Interview???:  Yes Interpreter: No Medical Clearance:  No Special Scheduling Instructions: No Any known health/anesthesia issues, diabetes, sleep apnea, latex allergy, defibrillator/pacemaker?: No Acuity: P3   (P1 highest, P2 delay may cause harm, P3 low, elective gyn, P4 lowest)

## 2020-08-18 NOTE — Telephone Encounter (Signed)
Called patient to schedule Diagnostic laparoscopy and cystectomy w [facility]  DOS 09/09/20  H&P 2/15 @ 8:10   Covid testing 09/07/20 @ 8-10:30, [address], drive up and wear mask. Advised pt to quarantine until DOS.  Pre-admit phone call appointment to be requested - date and time will be included on H&P paper work. Also all appointments will be updated on pt MyChart. Explained that this appointment has a call window. Based on the time scheduled will indicate if the call will be received within a 4 hour window before 1:00 or after.  Advised that pt may also receive calls from the hospital pharmacy and pre-service center.  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 No secondary insurance.

## 2020-08-20 NOTE — Telephone Encounter (Signed)
completed

## 2020-08-25 ENCOUNTER — Ambulatory Visit (INDEPENDENT_AMBULATORY_CARE_PROVIDER_SITE_OTHER): Payer: 59 | Admitting: Psychology

## 2020-08-25 DIAGNOSIS — F4310 Post-traumatic stress disorder, unspecified: Secondary | ICD-10-CM | POA: Diagnosis not present

## 2020-08-31 ENCOUNTER — Ambulatory Visit (INDEPENDENT_AMBULATORY_CARE_PROVIDER_SITE_OTHER): Payer: Managed Care, Other (non HMO) | Admitting: Obstetrics and Gynecology

## 2020-08-31 ENCOUNTER — Other Ambulatory Visit: Payer: Self-pay

## 2020-08-31 ENCOUNTER — Encounter: Payer: Self-pay | Admitting: Obstetrics and Gynecology

## 2020-08-31 VITALS — BP 114/70 | Ht 68.0 in | Wt 201.4 lb

## 2020-08-31 DIAGNOSIS — N9489 Other specified conditions associated with female genital organs and menstrual cycle: Secondary | ICD-10-CM | POA: Diagnosis not present

## 2020-08-31 DIAGNOSIS — N958 Other specified menopausal and perimenopausal disorders: Secondary | ICD-10-CM

## 2020-08-31 NOTE — Patient Instructions (Signed)
Minimally Invasive Adnexal Surgery, Care After This sheet gives you information about how to care for yourself after your procedure. Your health care provider may also give you more specific instructions. If you have problems or questions, contact your health care provider. What can I expect after the procedure? After the procedure, it is common to have:  Mild cramping in your lower abdomen.  Tiredness (fatigue). You may feel tired for a few days. Follow these instructions at home: Incision care  Follow instructions from your health care provider about how to take care of your incisions. Make sure you: ? Wash your hands with soap and water before and after you change your bandage (dressing). If soap and water are not available, use hand sanitizer. ? Change your dressing as told by your health care provider. ? Leave stitches (sutures), skin glue, or adhesive strips in place. These skin closures may need to stay in place for 2 weeks or longer. If adhesive strip edges start to loosen and curl up, you may trim the loose edges. Do not remove adhesive strips completely unless your health care provider tells you to do that.  Check your incision areas every day for signs of infection. Check for: ? Redness, swelling, or pain that gets worse. ? Fluid or blood. ? Warmth. ? Pus or a bad smell.  Keep your incision areas clean and dry.  Do not take baths, swim, or use a hot tub until your health care provider approves. Ask your health care provider if you may take showers. You may only be allowed to take sponge baths.   Activity  Return to your normal activities as told by your health care provider. Ask your health care provider what activities are safe for you. Ask about: ? Returning to work and exercise. ? Any weight restrictions you may have. ? When you can resume sexual activity.  Rest as told by your health care provider.  Avoid sitting for a long time without moving. Get up to take short  walks every 1-2 hours. This is important to improve blood flow and breathing. Ask for help if you feel weak or unsteady. General instructions  Take over-the-counter and prescription medicines only as told by your health care provider.  Ask your health care provider if the medicine prescribed to you: ? Requires you to avoid driving or using heavy machinery. ? Can cause constipation. You may need to take these actions to prevent or treat constipation:  Drink enough fluid to keep your urine pale yellow.  Take over-the-counter or prescription medicines.  Eat foods that are high in fiber, such as beans, whole grains, and fresh fruits and vegetables.  Limit foods that are high in fat and processed sugars, such as fried or sweet foods.  Keep all follow-up visits as told by your health care provider. This is important. Contact a health care provider if:  You have unusual bleeding or discharge from your vagina.  You have pain in your abdomen that gets worse or does not get better with medicine.  You feel nauseous.  You have redness, swelling, or pain around any of your incisions.  You have fluid or blood coming from any of your incisions.  Any of your incisions feel warm to the touch.  You have pus or a bad smell coming from any of your incisions. Get help right away if:  You have a fever.  You have a lot of unusual bleeding or discharge from your vagina.  You have severe pain  in your abdomen.  You cannot stop vomiting.  You have nausea that does not get better.  You faint.  You are unable to empty your bladder. Summary  After the procedure, it is common to have mild cramping and to feel tired for a few days.  Return to your normal activities as told by your health care provider. Ask your health care provider what activities are safe for you.  Contact a health care provider if you have complications, such as unusual bleeding or discharge, nausea, pain that gets worse, or  fluid or blood coming from any of your incisions.  Keep all follow-up visits as told by your health care provider. This information is not intended to replace advice given to you by your health care provider. Make sure you discuss any questions you have with your health care provider. Document Revised: 09/18/2018 Document Reviewed: 09/18/2018 Elsevier Patient Education  [place].

## 2020-09-01 ENCOUNTER — Ambulatory Visit (INDEPENDENT_AMBULATORY_CARE_PROVIDER_SITE_OTHER): Payer: 59 | Admitting: Psychology

## 2020-09-01 ENCOUNTER — Encounter
Admission: RE | Admit: 2020-09-01 | Discharge: 2020-09-01 | Disposition: A | Discharge status: Home / Self Care | Payer: Managed Care, Other (non HMO) | Source: Ambulatory Visit | Attending: Obstetrics and Gynecology | Admitting: Obstetrics and Gynecology

## 2020-09-01 DIAGNOSIS — F4310 Post-traumatic stress disorder, unspecified: Secondary | ICD-10-CM

## 2020-09-01 HISTORY — DX: Anxiety disorder, unspecified: F41.9

## 2020-09-01 HISTORY — DX: Depression, unspecified: F32.A

## 2020-09-01 NOTE — Patient Instructions (Signed)
Your procedure is scheduled on: Thursday, February 24 Report to the Registration Desk on the 1st floor of the [facility]. To find out your arrival time, please call [phone] between 1PM - 3PM on: Wednesday, February 23  REMEMBER: Instructions that are not followed completely may result in serious medical risk, up to and including death; or upon the discretion of your surgeon and anesthesiologist your surgery may need to be rescheduled.  Do not eat food after midnight the night before surgery.  No gum chewing, lozengers or hard candies.  You may however, drink CLEAR liquids up to 2 hours before you are scheduled to arrive for your surgery. Do not drink anything within 2 hours of your scheduled arrival time.  Clear liquids include: - water  - apple juice without pulp - gatorade (not RED, PURPLE, OR BLUE) - black coffee or tea (Do NOT add milk or creamers to the coffee or tea) Do NOT drink anything that is not on this list.  TAKE THESE MEDICATIONS THE MORNING OF SURGERY WITH A SIP OF WATER:  1.  escitalopram (Lexapro)  One week prior to surgery: starting February 17 Stop Anti-inflammatories (NSAIDS) such as Advil, Aleve, Ibuprofen, Motrin, Naproxen, Naprosyn and Aspirin based products such as Excedrin, Goodys Powder, BC Powder. Stop ANY OVER THE COUNTER supplements until after surgery.  No Alcohol for 24 hours before or after surgery.  No Smoking including e-cigarettes for 24 hours prior to surgery.  No chewable tobacco products for at least 6 hours prior to surgery.  No nicotine patches on the day of surgery.  Do not use any "recreational" drugs for at least a week prior to your surgery.  Please be advised that the combination of cocaine and anesthesia may have negative outcomes, up to and including death. If you test positive for cocaine, your surgery will be cancelled.  On the morning of surgery brush your teeth with toothpaste and water, you may rinse your mouth with  mouthwash if you wish. Do not swallow any toothpaste or mouthwash.  Do not wear jewelry, make-up, hairpins, clips or nail polish.  Do not wear lotions, powders, or perfumes.   Do not shave body from the neck down 48 hours prior to surgery just in case you cut yourself which could leave a site for infection.  Also, freshly shaved skin may become irritated if using the CHG soap.  Contact lenses, hearing aids and dentures may not be worn into surgery.  Do not bring valuables to the hospital. [facility] is not responsible for any missing/lost belongings or valuables.   Use CHG Soap as directed on instruction sheet.  Notify your doctor if there is any change in your medical condition (cold, fever, infection).  Wear comfortable clothing (specific to your surgery type) to the hospital.  Plan for stool softeners for home use; pain medications have a tendency to cause constipation. You can also help prevent constipation by eating foods high in fiber such as fruits and vegetables and drinking plenty of fluids as your diet allows.  After surgery, you can help prevent lung complications by doing breathing exercises.  Take deep breaths and cough every 1-2 hours. Your doctor may order a device called an Incentive Spirometer to help you take deep breaths. When coughing or sneezing, hold a pillow firmly against your incision with both hands. This is called "splinting." Doing this helps protect your incision. It also decreases belly discomfort.  If you are being discharged the day of surgery, you will  not be allowed to drive home. You will need a responsible adult (18 years or older) to drive you home and stay with you that night.   If you are taking public transportation, you will need to have a responsible adult (18 years or older) with you. Please confirm with your physician that it is acceptable to use public transportation.   Please call the [facility] at [phone] if you  have any questions about these instructions.  Visitation Policy:  Patients undergoing a surgery or procedure may have one family member or support person with them as long as that person is not COVID-19 positive or experiencing its symptoms.  That person may remain in the waiting area during the procedure.
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
## 2020-09-07 ENCOUNTER — Other Ambulatory Visit: Payer: Self-pay

## 2020-09-07 ENCOUNTER — Encounter
Admission: RE | Admit: 2020-09-07 | Discharge: 2020-09-07 | Disposition: A | Discharge status: Home / Self Care | Payer: Managed Care, Other (non HMO) | Source: Ambulatory Visit | Attending: Obstetrics and Gynecology | Admitting: Obstetrics and Gynecology

## 2020-09-07 ENCOUNTER — Other Ambulatory Visit: Payer: Managed Care, Other (non HMO)

## 2020-09-07 DIAGNOSIS — Z20822 Contact with and (suspected) exposure to covid-19: Secondary | ICD-10-CM | POA: Diagnosis not present

## 2020-09-07 DIAGNOSIS — Z01812 Encounter for preprocedural laboratory examination: Secondary | ICD-10-CM | POA: Insufficient documentation

## 2020-09-07 LAB — TYPE AND SCREEN
ABO/RH(D): O POS
Antibody Screen: NEGATIVE

## 2020-09-07 LAB — CBC
HCT: 40.2 % (ref 36.0–46.0)
Hemoglobin: 12.9 g/dL (ref 12.0–15.0)
MCH: 27.4 pg (ref 26.0–34.0)
MCHC: 32.1 g/dL (ref 30.0–36.0)
MCV: 85.4 fL (ref 80.0–100.0)
Platelets: 212 10*3/uL (ref 150–400)
RBC: 4.71 MIL/uL (ref 3.87–5.11)
RDW: 12.9 % (ref 11.5–15.5)
WBC: 7.7 10*3/uL (ref 4.0–10.5)
nRBC: 0 % (ref 0.0–0.2)

## 2020-09-07 LAB — SARS CORONAVIRUS 2 (TAT 6-24 HRS): SARS Coronavirus 2: NEGATIVE

## 2020-09-08 ENCOUNTER — Ambulatory Visit (INDEPENDENT_AMBULATORY_CARE_PROVIDER_SITE_OTHER): Payer: 59 | Admitting: Psychology

## 2020-09-08 DIAGNOSIS — F4310 Post-traumatic stress disorder, unspecified: Secondary | ICD-10-CM | POA: Diagnosis not present

## 2020-09-09 ENCOUNTER — Other Ambulatory Visit: Payer: Self-pay

## 2020-09-09 ENCOUNTER — Ambulatory Visit: Payer: Managed Care, Other (non HMO) | Admitting: Certified Registered"

## 2020-09-09 ENCOUNTER — Encounter
Admission: RE | Disposition: A | Discharge status: Home / Self Care | Payer: Self-pay | Source: Home / Self Care | Attending: Obstetrics and Gynecology

## 2020-09-09 ENCOUNTER — Encounter: Payer: Self-pay | Admitting: Obstetrics and Gynecology

## 2020-09-09 ENCOUNTER — Ambulatory Visit
Admission: RE | Admit: 2020-09-09 | Discharge: 2020-09-09 | Disposition: A | Discharge status: Home / Self Care | Payer: Managed Care, Other (non HMO) | Attending: Obstetrics and Gynecology | Admitting: Obstetrics and Gynecology

## 2020-09-09 DIAGNOSIS — Z79899 Other long term (current) drug therapy: Secondary | ICD-10-CM | POA: Insufficient documentation

## 2020-09-09 DIAGNOSIS — N838 Other noninflammatory disorders of ovary, fallopian tube and broad ligament: Secondary | ICD-10-CM | POA: Diagnosis not present

## 2020-09-09 DIAGNOSIS — N9489 Other specified conditions associated with female genital organs and menstrual cycle: Secondary | ICD-10-CM | POA: Diagnosis not present

## 2020-09-09 DIAGNOSIS — Z91018 Allergy to other foods: Secondary | ICD-10-CM | POA: Insufficient documentation

## 2020-09-09 DIAGNOSIS — N958 Other specified menopausal and perimenopausal disorders: Secondary | ICD-10-CM

## 2020-09-09 HISTORY — PX: ROBOTIC ASSISTED LAPAROSCOPIC OVARIAN CYSTECTOMY: SHX6081

## 2020-09-09 LAB — ABO/RH: ABO/RH(D): O POS

## 2020-09-09 LAB — POCT PREGNANCY, URINE: Preg Test, Ur: NEGATIVE

## 2020-09-09 SURGERY — EXCISION, CYST, OVARY, ROBOT-ASSISTED, LAPAROSCOPIC
Anesthesia: General | Laterality: Right

## 2020-09-09 MED ORDER — FENTANYL CITRATE (PF) 100 MCG/2ML IJ SOLN
INTRAMUSCULAR | Status: AC
  Filled 2020-09-09: qty 2

## 2020-09-09 MED ORDER — CHLORHEXIDINE GLUCONATE 0.12 % MT SOLN
15.0000 mL | Freq: Once | OROMUCOSAL | Status: AC
  Administered 2020-09-09: 15 mL via OROMUCOSAL

## 2020-09-09 MED ORDER — CHLORHEXIDINE GLUCONATE 0.12 % MT SOLN
OROMUCOSAL | Status: AC
  Filled 2020-09-09: qty 15

## 2020-09-09 MED ORDER — LACTATED RINGERS IV SOLN: INTRAVENOUS | Status: DC

## 2020-09-09 MED ORDER — PROPOFOL 10 MG/ML IV BOLUS
INTRAVENOUS | Status: DC | PRN
  Administered 2020-09-09: 150 mg via INTRAVENOUS

## 2020-09-09 MED ORDER — PHENYLEPHRINE HCL (PRESSORS) 10 MG/ML IV SOLN
INTRAVENOUS | Status: DC | PRN
  Administered 2020-09-09: 100 ug via INTRAVENOUS

## 2020-09-09 MED ORDER — ONDANSETRON HCL 4 MG/2ML IJ SOLN: 4.0000 mg | Freq: Once | INTRAMUSCULAR | Status: DC | PRN

## 2020-09-09 MED ORDER — BUPIVACAINE LIPOSOME 1.3 % IJ SUSP
INTRAMUSCULAR | Status: DC | PRN
  Administered 2020-09-09: 20 mL

## 2020-09-09 MED ORDER — MIDAZOLAM HCL 2 MG/2ML IJ SOLN
INTRAMUSCULAR | Status: AC
  Filled 2020-09-09: qty 2

## 2020-09-09 MED ORDER — ORAL CARE MOUTH RINSE: 15.0000 mL | Freq: Once | OROMUCOSAL | Status: AC

## 2020-09-09 MED ORDER — FENTANYL CITRATE (PF) 100 MCG/2ML IJ SOLN: 25.0000 ug | INTRAMUSCULAR | Status: DC | PRN

## 2020-09-09 MED ORDER — FENTANYL CITRATE (PF) 100 MCG/2ML IJ SOLN
INTRAMUSCULAR | Status: DC | PRN
  Administered 2020-09-09 (×4): 50 ug via INTRAVENOUS

## 2020-09-09 MED ORDER — KETOROLAC TROMETHAMINE 30 MG/ML IJ SOLN
INTRAMUSCULAR | Status: DC | PRN
  Administered 2020-09-09: 30 mg via INTRAVENOUS

## 2020-09-09 MED ORDER — SUGAMMADEX SODIUM 200 MG/2ML IV SOLN
INTRAVENOUS | Status: DC | PRN
  Administered 2020-09-09: 200 mg via INTRAVENOUS

## 2020-09-09 MED ORDER — ACETAMINOPHEN 500 MG PO TABS: 1000.0000 mg | ORAL_TABLET | Freq: Four times a day (QID) | ORAL | 0 refills | Status: DC | PRN

## 2020-09-09 MED ORDER — ONDANSETRON HCL 4 MG/2ML IJ SOLN
INTRAMUSCULAR | Status: DC | PRN
  Administered 2020-09-09: 4 mg via INTRAVENOUS

## 2020-09-09 MED ORDER — MEPERIDINE HCL 50 MG/ML IJ SOLN: 6.2500 mg | INTRAMUSCULAR | Status: DC | PRN

## 2020-09-09 MED ORDER — POVIDONE-IODINE 10 % EX SWAB
2.0000 "application " | Freq: Once | CUTANEOUS | Status: AC
  Administered 2020-09-09: 2 via TOPICAL

## 2020-09-09 MED ORDER — EPHEDRINE SULFATE 50 MG/ML IJ SOLN
INTRAMUSCULAR | Status: DC | PRN
  Administered 2020-09-09 (×2): 10 mg via INTRAVENOUS

## 2020-09-09 MED ORDER — MIDAZOLAM HCL 2 MG/2ML IJ SOLN
INTRAMUSCULAR | Status: DC | PRN
  Administered 2020-09-09: 2 mg via INTRAVENOUS

## 2020-09-09 MED ORDER — EPHEDRINE 5 MG/ML INJ
INTRAVENOUS | Status: AC
  Filled 2020-09-09: qty 10

## 2020-09-09 MED ORDER — IBUPROFEN 600 MG PO TABS: 600.0000 mg | ORAL_TABLET | Freq: Four times a day (QID) | ORAL | 0 refills | Status: DC | PRN

## 2020-09-09 MED ORDER — DEXAMETHASONE SODIUM PHOSPHATE 10 MG/ML IJ SOLN
INTRAMUSCULAR | Status: DC | PRN
  Administered 2020-09-09: 10 mg via INTRAVENOUS

## 2020-09-09 MED ORDER — FAMOTIDINE 20 MG PO TABS
ORAL_TABLET | ORAL | Status: AC
  Filled 2020-09-09: qty 1

## 2020-09-09 MED ORDER — ROCURONIUM BROMIDE 100 MG/10ML IV SOLN
INTRAVENOUS | Status: DC | PRN
  Administered 2020-09-09: 20 mg via INTRAVENOUS
  Administered 2020-09-09: 50 mg via INTRAVENOUS

## 2020-09-09 MED ORDER — LIDOCAINE HCL (CARDIAC) PF 100 MG/5ML IV SOSY
PREFILLED_SYRINGE | INTRAVENOUS | Status: DC | PRN
  Administered 2020-09-09: 50 mg via INTRAVENOUS

## 2020-09-09 MED ORDER — FAMOTIDINE 20 MG PO TABS
20.0000 mg | ORAL_TABLET | Freq: Once | ORAL | Status: AC
  Administered 2020-09-09: 20 mg via ORAL

## 2020-09-09 MED ORDER — GLYCOPYRROLATE 0.2 MG/ML IJ SOLN
INTRAMUSCULAR | Status: DC | PRN
  Administered 2020-09-09: .2 mg via INTRAVENOUS

## 2020-09-09 MED ORDER — ACETAMINOPHEN 10 MG/ML IV SOLN
INTRAVENOUS | Status: DC | PRN
  Administered 2020-09-09: 1000 mg via INTRAVENOUS

## 2020-09-09 MED ORDER — ACETAMINOPHEN 10 MG/ML IV SOLN
INTRAVENOUS | Status: AC
  Filled 2020-09-09: qty 100

## 2020-09-09 MED ORDER — OXYCODONE HCL 5 MG PO TABS: 5.0000 mg | ORAL_TABLET | Freq: Three times a day (TID) | ORAL | 0 refills | Status: DC | PRN

## 2020-09-09 SURGICAL SUPPLY — 94 items
ADH SKN CLS APL DERMABOND .7 (GAUZE/BANDAGES/DRESSINGS) ×1
APL PRP STRL LF DISP 70% ISPRP (MISCELLANEOUS) ×1
APL SRG 38 LTWT LNG FL B (MISCELLANEOUS)
APPLICATOR ARISTA FLEXITIP XL (MISCELLANEOUS) IMPLANT
BAG DRN RND TRDRP ANRFLXCHMBR (UROLOGICAL SUPPLIES) ×1
BAG LAPAROSCOPIC 12 15 PORT 16 (BASKET) IMPLANT
BAG RETRIEVAL 12/15 (BASKET)
BAG URINE DRAIN 2000ML AR STRL (UROLOGICAL SUPPLIES) ×2 IMPLANT
BASIN GRAD PLASTIC 32OZ STRL (MISCELLANEOUS) ×2 IMPLANT
BLADE SURG 11 STRL SS SAFETY (MISCELLANEOUS) IMPLANT
BLADE SURG SZ10 CARB STEEL (BLADE) ×2 IMPLANT
BLADE SURG SZ11 CARB STEEL (BLADE) ×2 IMPLANT
CANNULA REDUC XI 12-8 STAPL (CANNULA) ×1
CANNULA REDUCER 12-8 DVNC XI (CANNULA) ×1 IMPLANT
CATH FOLEY 2WAY  5CC 16FR (CATHETERS) ×1
CATH FOLEY 2WAY 5CC 16FR (CATHETERS) ×1
CATH URTH 16FR FL 2W BLN LF (CATHETERS) ×1 IMPLANT
CHLORAPREP W/TINT 26 (MISCELLANEOUS) ×2 IMPLANT
COVER BACK TABLE REUSABLE LG (DRAPES) ×2 IMPLANT
COVER LIGHT HANDLE STERIS (MISCELLANEOUS) ×2 IMPLANT
COVER MAYO STAND REUSABLE (DRAPES) ×2 IMPLANT
COVER TIP SHEARS 8 DVNC (MISCELLANEOUS) ×2 IMPLANT
COVER TIP SHEARS 8MM DA VINCI (MISCELLANEOUS) ×2
COVER WAND RF STERILE (DRAPES) ×4 IMPLANT
DEFOGGER SCOPE WARMER CLEARIFY (MISCELLANEOUS) ×2 IMPLANT
DERMABOND ADVANCED (GAUZE/BANDAGES/DRESSINGS) ×1
DERMABOND ADVANCED .7 DNX12 (GAUZE/BANDAGES/DRESSINGS) ×1 IMPLANT
DRAPE 3/4 80X56 (DRAPES) ×4 IMPLANT
DRAPE ARM DVNC X/XI (DISPOSABLE) ×3 IMPLANT
DRAPE COLUMN DVNC XI (DISPOSABLE) ×1 IMPLANT
DRAPE DA VINCI XI ARM (DISPOSABLE) ×3
DRAPE DA VINCI XI COLUMN (DISPOSABLE) ×1
DRAPE UNDER BUTTOCK W/FLU (DRAPES) ×2 IMPLANT
DRSG TEGADERM 2-3/8X2-3/4 SM (GAUZE/BANDAGES/DRESSINGS) ×8 IMPLANT
ELECT REM PT RETURN 9FT ADLT (ELECTROSURGICAL) ×2
ELECTRODE REM PT RTRN 9FT ADLT (ELECTROSURGICAL) ×1 IMPLANT
EXTRT SYSTEM ALEXIS 17CM (MISCELLANEOUS)
GAUZE 4X4 16PLY RFD (DISPOSABLE) ×2 IMPLANT
GLOVE BIOGEL PI IND STRL 7.5 (GLOVE) ×2 IMPLANT
GLOVE BIOGEL PI INDICATOR 7.5 (GLOVE) ×2
GLOVE SURG ENC MOIS LTX SZ7 (GLOVE) ×4 IMPLANT
GOWN STRL REUS W/ TWL LRG LVL3 (GOWN DISPOSABLE) ×8 IMPLANT
GOWN STRL REUS W/TWL LRG LVL3 (GOWN DISPOSABLE) ×16
GRASPER SUT TROCAR 14GX15 (MISCELLANEOUS) ×2 IMPLANT
HEMOSTAT ARISTA ABSORB 3G PWDR (HEMOSTASIS) IMPLANT
IRRIGATION STRYKERFLOW (MISCELLANEOUS) IMPLANT
IRRIGATOR STRYKERFLOW (MISCELLANEOUS)
IRRIGATOR SUCT 8 DISP DVNC XI (IRRIGATION / IRRIGATOR) IMPLANT
IRRIGATOR SUCTION 8MM XI DISP (IRRIGATION / IRRIGATOR)
IV NS 1000ML (IV SOLUTION) ×2
IV NS 1000ML BAXH (IV SOLUTION) ×1 IMPLANT
KIT PINK PAD W/HEAD ARE REST (MISCELLANEOUS) ×2
KIT PINK PAD W/HEAD ARM REST (MISCELLANEOUS) ×1 IMPLANT
LABEL OR SOLS (LABEL) ×2 IMPLANT
MANIFOLD NEPTUNE II (INSTRUMENTS) ×2 IMPLANT
MANIPULATOR VCARE LG CRV RETR (MISCELLANEOUS) IMPLANT
MANIPULATOR VCARE SML CRV RETR (MISCELLANEOUS) IMPLANT
MANIPULATOR VCARE STD CRV RETR (MISCELLANEOUS) IMPLANT
NEEDLE HYPO 22GX1.5 SAFETY (NEEDLE) ×2 IMPLANT
NS IRRIG 1000ML POUR BTL (IV SOLUTION) ×4 IMPLANT
OBTURATOR OPTICAL STANDARD 8MM (TROCAR) ×1
OBTURATOR OPTICAL STND 8 DVNC (TROCAR) ×1
OBTURATOR OPTICALSTD 8 DVNC (TROCAR) ×1 IMPLANT
OCCLUDER COLPOPNEUMO (BALLOONS) IMPLANT
PACK GYN LAPAROSCOPIC (MISCELLANEOUS) ×2 IMPLANT
PAD ARMBOARD 7.5X6 YLW CONV (MISCELLANEOUS) ×2 IMPLANT
PAD OB MATERNITY 4.3X12.25 (PERSONAL CARE ITEMS) ×2 IMPLANT
PAD PREP 24X41 OB/GYN DISP (PERSONAL CARE ITEMS) ×2 IMPLANT
RETRACTOR WOUND ALXS 18CM SML (MISCELLANEOUS) IMPLANT
RTRCTR WOUND ALEXIS O 18CM SML (MISCELLANEOUS)
SEAL CANN UNIV 5-8 DVNC XI (MISCELLANEOUS) ×3 IMPLANT
SEAL XI 5MM-8MM UNIVERSAL (MISCELLANEOUS) ×3
SEALER VESSEL DA VINCI XI (MISCELLANEOUS)
SEALER VESSEL EXT DVNC XI (MISCELLANEOUS) IMPLANT
SET CYSTO W/LG BORE CLAMP LF (SET/KITS/TRAYS/PACK) IMPLANT
SET TRI-LUMEN FLTR TB AIRSEAL (TUBING) ×2 IMPLANT
SOLUTION ELECTROLUBE (MISCELLANEOUS) ×2 IMPLANT
SPONGE GAUZE 2X2 8PLY STRL LF (GAUZE/BANDAGES/DRESSINGS) ×8 IMPLANT
SPONGE LAP 18X18 RF (DISPOSABLE) ×2 IMPLANT
STAPLER CANNULA SEAL DVNC XI (STAPLE) ×1 IMPLANT
STAPLER CANNULA SEAL XI (STAPLE) ×1
SURGILUBE 2OZ TUBE FLIPTOP (MISCELLANEOUS) ×2 IMPLANT
SUT MNCRL 4-0 (SUTURE) ×2
SUT MNCRL 4-0 27XMFL (SUTURE) ×1
SUT VIC AB 0 CT1 36 (SUTURE) ×2 IMPLANT
SUT VLOC 180 0 6IN GS21 (SUTURE) ×2 IMPLANT
SUTURE MNCRL 4-0 27XMF (SUTURE) ×1 IMPLANT
SYR 10ML LL (SYRINGE) ×2 IMPLANT
SYR 50ML LL SCALE MARK (SYRINGE) ×2 IMPLANT
SYSTEM CONTND EXTRCTN KII BLLN (MISCELLANEOUS) IMPLANT
TROCAR 5M 150ML BLDLS (TROCAR) ×2 IMPLANT
TROCAR PORT AIRSEAL 5X120 (TROCAR) ×2 IMPLANT
TROCAR PORT AIRSEAL 8X100 (TROCAR) ×2 IMPLANT
TROCAR XCEL NON-BLD 5MMX100MML (ENDOMECHANICALS) ×2 IMPLANT

## 2020-09-09 NOTE — Discharge Instructions (Signed)
Laparoscopy, Care After The following information offers guidance on how to care for yourself after your procedure. Your health care provider may also give you more specific instructions. If you have problems or questions, contact your health care provider. What can I expect after the procedure? After the procedure, it is common to have:  Mild discomfort in the abdomen.  Sore throat. Women who have laparoscopy with a pelvic examination may have mild cramping and fluid coming from the vagina for a few days after the procedure. Follow these instructions at home: Medicines  Take over-the-counter and prescription medicines only as told by your health care provider.  If you were prescribed an antibiotic medicine, take it as told by your health care provider. Do not stop taking the antibiotic even if you start to feel better.  Ask your health care provider if the medicine prescribed to you: ? Requires you to avoid driving or using machinery. ? Can cause constipation. You may need to take these actions to prevent or treat constipation:  Drink enough fluid to keep your urine pale yellow.  Take over-the-counter or prescription medicines.  Eat foods that are high in fiber, such as beans, whole grains, and fresh fruits and vegetables.  Limit foods that are high in fat and processed sugars, such as fried or sweet foods. Incision care  Follow instructions from your health care provider about how to take care of your incisions. Make sure you: ? Wash your hands with soap and water for at least 20 seconds before and after you change your bandage (dressing). If soap and water are not available, use hand sanitizer. ? Change your dressing as told by your health care provider. ? Leave stitches (sutures), skin glue, or surgical tape in place. These skin closures may need to stay in place for 2 weeks or longer. If surgical tape edges start to loosen and curl up, you may trim the loose edges. Do not remove  the surgical tape completely unless your health care provider tells you to do that.  Check your incision areas every day for signs of infection. Check for: ? Redness, swelling, or pain. ? Fluid or blood. ? Warmth. ? Pus or a bad smell.   Activity  Return to your normal activities as told by your health care provider. Ask your health care provider what activities are safe for you.  Do not lift anything that is heavier than 10 lb (4.5 kg), or the limit that you are told, until your health care provider says that it is safe.  Avoid sitting for a long time without moving. Get up to take short walks every 1-2 hours. This is important to improve blood flow and breathing. Ask for help if you feel weak or unsteady. General instructions  Do not use any products that contain nicotine or tobacco. These products include cigarettes, chewing tobacco, and vaping devices, such as e-cigarettes. If you need help quitting, ask your health care provider.  If you were given a sedative during the procedure, it can affect you for several hours. Do not drive or operate machinery until your health care provider says that it is safe.  Do not take baths, swim, or use a hot tub until your health care provider approves. Ask your health care provider if you may take showers. You may only be allowed to take sponge baths.  Keep all follow-up visits. This is important. Contact a health care provider if:  You develop shoulder pain.  You feel light-headed or faint.  You are unable to pass gas or have a bowel movement.  You feel nauseous or you vomit.  You develop a rash.  You have any of these signs of infection: ? Redness, swelling, or pain around an incision. ? Fluid or blood coming from an incision. ? Warmth coming from an incision. ? Pus or a bad smell coming from an incision. ? A fever or chills. Get help right away if:  You have severe pain.  You have vomiting that does not go away.  You have heavy  bleeding from the vagina.  Any incision opens up.  You have trouble breathing.  You have chest pain. These symptoms may represent a serious problem that is an emergency. Do not wait to see if the symptoms will go away. Get medical help right away. Call your local emergency services (911 in the [place] Do not drive yourself to the hospital. Summary  After the procedure, it is common to have mild discomfort in the abdomen and a sore throat.  Check your incision areas every day for signs of infection.  Return to your normal activities as told by your health care provider. Ask your health care provider what activities are safe for you. This information is not intended to replace advice given to you by your health care provider. Make sure you discuss any questions you have with your health care provider. Document Revised: 02/27/2020 Document Reviewed: 02/27/2020 Elsevier Patient Education  2021 [place]   1) The drugs that you were given will stay in your system until tomorrow so for the next 24 hours you should not:  A) Drive an automobile B) Make any legal decisions C) Drink any alcoholic beverage   2) You may resume regular meals tomorrow.  Today it is better to start with liquids and gradually work up to solid foods.  You may eat anything you prefer, but it is better to start with liquids, then soup and crackers, and gradually work up to solid foods.   3) Please notify your doctor immediately if you have any unusual bleeding, trouble breathing, redness and pain at the surgery site, drainage, fever, or pain not relieved by medication.    4) Additional Instructions:    Please contact your physician with any problems or Same Day Surgery at [phone], Monday through Friday 6 am to 4 pm, or [place] at [facility] number at [phone].

## 2020-09-09 NOTE — Op Note (Signed)
  Operative Note    PRE-OP DIAGNOSIS: Right Adnexal Cyst   POST-OP DIAGNOSIS: Right Adnexal Cyst  SURGEON: Adrian Prows MD  ANESTHESIA: General  PROCEDURE: Procedure(s):Robotic assisted laparoscopic right adnexal cystectomy   ESTIMATED BLOOD LOSS: 10 cc  DRAINS: Foley  SPECIMENS: Adnexal cyst wall  COMPLICATIONS: None  DISPOSITION: PACU  CONDITION: Stable  INDICATIONS: Adnexal cyst > 5 cm  FINDINGS: Exam under anesthesia revealed an enlarged 8 week uterus. There was a right adnexal mass. The parametria was smooth. The cervix was negative for gross lesions. Intraoperative findings included: The right adnexa had a simple cyst.  Appendix was normal.   PROCEDURE IN DETAIL: After informed consent was obtained, the patient was taken to the operating room where anesthesia was obtained without difficulty. The patient was positioned in the dorsal lithotomy position in [place] and her arms were carefully tucked at her sides and the usual precautions were taken.  She was prepped and draped in normal sterile fashion.  Time-out was performed. A foley was placed. A speculum was placed in the vagina and the cervical os was dilated. The uterus sounded to 8 cm.  A standard uterine manipulator was then placed in the uterus without incident.    Operative entry was obtained via a supraumbilical incision and direct entry. The Optiview port was placed, abdomen insuffulated, and pelvis visualized with noted findings above.  The patient was placed in Trendelenburg and the bowel was displaced up into the upper abdomen.  The robotic ports were placed and robotic docking was performed.   The right adnexal cyst was identified. The cyst was was incised. Clear fluid drained from the cyst. The cyst wall was dissected in entirety and removed through the robotic port.  The tissue was hemostatic after removal of the cyst.   The trocars were removed.   The skin incisions were closed with subcuticular  stitch and Indermil glue.  The patient tolerated the procedure well.  Sponge, lap and needle counts were correct x2.  The patient was taken to recovery room in excellent condition.  Adrian Prows MD, [facility], [facility] 09/09/2020 5:11 PM
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
## 2020-09-09 NOTE — Transfer of Care (Signed)
Immediate Anesthesia Transfer of Care Note  Patient: Monica Forbes  Procedure(s) Performed: XI ROBOTIC ASSISTED LAPAROSCOPIC ADNEXAL CYSTECTOMY (Right )  Patient Location: PACU  Anesthesia Type:General  Level of Consciousness: awake and alert   Airway & Oxygen Therapy: Patient Spontanous Breathing  Post-op Assessment: Report given to RN and Post -op Vital signs reviewed and stable  Post vital signs: Reviewed  Last Vitals:  Vitals Value Taken Time  BP    Temp    Pulse    Resp    SpO2      Last Pain:  Vitals:   09/09/20 1302  TempSrc: Oral  PainSc: 0-No pain         Complications: No complications documented.

## 2020-09-09 NOTE — Interval H&P Note (Signed)
History and Physical Interval Note:  09/09/2020 2:13 PM  Monica Forbes  has presented today for surgery, with the diagnosis of Left adnexal cyst.  The various methods of treatment have been discussed with the patient and family. After consideration of risks, benefits and other options for treatment, the patient has consented to  Procedure(s): XI ROBOTIC [place] (N/A) as a surgical intervention.  The patient's history has been reviewed, patient examined, no change in status, stable for surgery.  I have reviewed the patient's chart and labs.  Questions were answered to the patient's satisfaction.     [place]

## 2020-09-09 NOTE — Anesthesia Procedure Notes (Signed)
Procedure Name: Intubation Performed by: Rolla Plate, CRNA Pre-anesthesia Checklist: Patient identified, Patient being monitored, Timeout performed, Emergency Drugs available and Suction available Patient Re-evaluated:Patient Re-evaluated prior to induction Oxygen Delivery Method: Circle system utilized Preoxygenation: Pre-oxygenation with 100% oxygen Induction Type: IV induction Ventilation: Mask ventilation without difficulty Laryngoscope Size: 3 and McGraph Grade View: Grade I Tube type: Oral Tube size: 6.5 mm Number of attempts: 1 Airway Equipment and Method: Stylet and Video-laryngoscopy Placement Confirmation: ETT inserted through vocal cords under direct vision,  positive ETCO2 and breath sounds checked- equal and bilateral Secured at: 21 cm Tube secured with: Tape Dental Injury: Teeth and Oropharynx as per pre-operative assessment

## 2020-09-09 NOTE — Anesthesia Postprocedure Evaluation (Signed)
Anesthesia Post Note  Patient: Monica Forbes  Procedure(s) Performed: XI ROBOTIC ASSISTED LAPAROSCOPIC ADNEXAL CYSTECTOMY (Right )  Patient location during evaluation: PACU Anesthesia Type: General Level of consciousness: awake and alert Pain management: pain level controlled Vital Signs Assessment: post-procedure vital signs reviewed and stable Respiratory status: spontaneous breathing, nonlabored ventilation, respiratory function stable and patient connected to nasal cannula oxygen Cardiovascular status: blood pressure returned to baseline and stable Postop Assessment: no apparent nausea or vomiting Anesthetic complications: no   No complications documented.   Last Vitals:  Vitals:   09/09/20 1745 09/09/20 1800  BP: 115/64 123/66  Pulse: 84 76  Resp: 18 20  Temp: 36.5 C 36.6 C  SpO2: 97% 98%    Last Pain:  Vitals:   09/09/20 1800  TempSrc:   PainSc: 0-No pain                 Martha Clan

## 2020-09-10 ENCOUNTER — Encounter: Payer: Self-pay | Admitting: Obstetrics and Gynecology

## 2020-09-13 LAB — SURGICAL PATHOLOGY

## 2020-09-15 ENCOUNTER — Ambulatory Visit (INDEPENDENT_AMBULATORY_CARE_PROVIDER_SITE_OTHER): Payer: 59 | Admitting: Psychology

## 2020-09-15 DIAGNOSIS — F4310 Post-traumatic stress disorder, unspecified: Secondary | ICD-10-CM | POA: Diagnosis not present

## 2020-09-16 ENCOUNTER — Encounter: Payer: Self-pay | Admitting: Psychology

## 2020-09-16 ENCOUNTER — Encounter: Payer: Self-pay | Admitting: Obstetrics and Gynecology

## 2020-09-16 ENCOUNTER — Ambulatory Visit (INDEPENDENT_AMBULATORY_CARE_PROVIDER_SITE_OTHER): Payer: Managed Care, Other (non HMO) | Admitting: Obstetrics and Gynecology

## 2020-09-16 ENCOUNTER — Other Ambulatory Visit: Payer: Self-pay

## 2020-09-16 ENCOUNTER — Encounter: Payer: Managed Care, Other (non HMO) | Attending: Psychology | Admitting: Psychology

## 2020-09-16 VITALS — BP 118/70 | Ht 68.0 in | Wt 203.4 lb

## 2020-09-16 DIAGNOSIS — Z87820 Personal history of traumatic brain injury: Secondary | ICD-10-CM | POA: Diagnosis not present

## 2020-09-16 DIAGNOSIS — F4310 Post-traumatic stress disorder, unspecified: Secondary | ICD-10-CM

## 2020-09-16 DIAGNOSIS — F411 Generalized anxiety disorder: Secondary | ICD-10-CM

## 2020-09-16 DIAGNOSIS — Z9889 Other specified postprocedural states: Secondary | ICD-10-CM

## 2020-09-16 DIAGNOSIS — S069X4D Unspecified intracranial injury with loss of consciousness of 6 hours to 24 hours, subsequent encounter: Secondary | ICD-10-CM

## 2020-09-16 DIAGNOSIS — F09 Unspecified mental disorder due to known physiological condition: Secondary | ICD-10-CM

## 2020-09-16 NOTE — Progress Notes (Signed)
  Postoperative Follow-up Patient presents post op from robotic right adnexal cyst removal for right adnexal cyst , 1 week ago.  Subjective: Patient reports marked improvement in her preop symptoms. Eating a regular diet without difficulty. Pain is controlled without any medications.  Activity: normal activities of daily living. Patient reports additional symptom's since surgery of None.  Objective: BP 118/70   Ht 5\' 8"  (1.727 m)   Wt 203 lb 6.4 oz (92.3 kg)   LMP 08/21/2020   BMI 30.93 kg/m  Physical Exam Constitutional:      Appearance: She is well-developed.  HENT:     Head: Normocephalic and atraumatic.  Neck:     Thyroid: No thyromegaly.  Cardiovascular:     Rate and Rhythm: Normal rate and regular rhythm.     Heart sounds: Normal heart sounds.  Pulmonary:     Effort: Pulmonary effort is normal.     Breath sounds: Normal breath sounds.  Abdominal:     General: Bowel sounds are normal. There is no distension.     Palpations: Abdomen is soft. There is no mass.     Comments: Incisions, clean, dry, intact  Musculoskeletal:     Cervical back: Neck supple.  Neurological:     Mental Status: She is alert and oriented to person, place, and time.  Skin:    General: Skin is warm and dry.  Psychiatric:        Behavior: Behavior normal.        Thought Content: Thought content normal.        Judgment: Judgment normal.  Vitals reviewed.    Assessment: s/p :   robotic right adnexal cyst removal stable  Plan: Patient has done well after surgery with no apparent complications.  I have discussed the post-operative course to date, and the expected progress moving forward.  The patient understands what complications to be concerned about.  I will see the patient in routine follow up, or sooner if needed.    Activity plan: No heavy lifting. Pelvic rest.  Adrian Prows MD, [place], [facility] 09/16/2020 2:21 PM

## 2020-09-16 NOTE — Patient Instructions (Signed)
Incision Care, Adult An incision is a surgical cut that is made through your skin. Most incisions are closed after a surgical procedure. Your incision may be closed with stitches (sutures), staples, skin glue, or adhesive strips. You may need to return to your health care provider to have sutures or staples removed. This may occur several days or several weeks after your surgery. Until then, the incision needs to be cared for properly to prevent infection. Follow instructions from your health care provider about how to care for your incision. Supplies needed:  Soap, water, and a clean hand towel.  Wound cleanser.  A clean bandage (dressing), if needed.  Cream or ointment, if told by your health care provider.  Clean gauze. How to care for your incision Cleaning the incision Ask your health care provider how to clean the incision. This may include:  Using mild soap and water, or wound cleanser.  Using a clean gauze to pat the incision dry after cleaning it. Dressing changes  Wash your hands with soap and water for at least 20 seconds before and after you change the dressing. If soap and water are not available, use hand sanitizer.  Change your dressing as told by your health care provider.  Leave sutures, staples, skin glue, or adhesive strips in place. These skin closures may need to stay in place for 2 weeks or longer. If adhesive strip edges start to loosen and curl up, you may trim the loose edges. Do not remove adhesive strips completely unless your health care provider tells you to do that.  Apply cream or ointment. Do this only as told by your health care provider.  Cover the incision with a clean dressing. Ask your health care provider when you can begin leaving the incision uncovered. Checking for infection Check your incision area every day for signs of infection. Check for:  More redness, swelling, or pain.  More fluid or blood.  Warmth.  Pus or a bad smell.    Follow these instructions at home Medicines  Take over-the-counter and prescription medicines only as told by your health care provider.  If you were prescribed an antibiotic medicine, cream, or ointment, take or apply it as told by your health care provider. Do not stop using the antibiotic even if your condition improves. Eating and drinking  Eat a diet that includes protein, vitamin A, vitamin C, and other nutrient-rich foods to help the wound heal. ? Foods rich in protein include meat, fish, eggs, dairy, beans, and nuts. ? Foods rich in vitamin A include carrots and dark green, leafy vegetables. ? Foods rich in vitamin C include citrus fruits, tomatoes, broccoli, and peppers.  Drink enough fluid to keep your urine pale yellow. General instructions  Do not take baths, swim, use a hot tub, or do anything that would put the incision underwater until your health care provider approves. Ask your health care provider if you may take showers. You may only be allowed to take sponge baths.  Limit movement around your incision to promote healing. ? Avoid straining, lifting, or exercising for the first 2 weeks after your procedure, or for as long as told by your health care provider. ? Return to your normal activities as told by your health care provider. Ask your health care provider what activities are safe for you.  Do not scratch or pick at the incision. Keep it covered as told by your health care provider.  Protect your incision from the sun when you are   outside for the first 6 months, or for as long as told by your health care provider. Cover up the scar area or apply sunscreen that has an SPF of at least 30.  Do not use any products that contain nicotine or tobacco, such as cigarettes, e-cigarettes, and chewing tobacco. These can delay incision healing after surgery. If you need help quitting, ask your health care provider.  Keep all follow-up visits as told by your health care  provider. This is important.   Contact a health care provider if:  You have any of these signs of infection: ? More redness, swelling, or pain around your incision. ? More fluid or blood coming from your incision. ? Warmth coming from your incision. ? Pus or a bad smell coming from your incision. ? A fever.  You are nauseous or you vomit.  You are dizzy.  Your sutures, staples, skin glue, or adhesive strips come undone. Get help right away if:  You have a red streak on the skin near your incision.  Your incision bleeds through the dressing and the bleeding does not stop with gentle pressure.  The edges of your incision open up and separate.  You have signs of a serious bodily reaction to an infection. These signs may include: ? Fever, shaking chills, or feeling very cold. ? Confusion or anxiety. ? Severe pain. ? Trouble breathing. ? Fast heartbeat. ? Clammy or sweaty skin. ? A rash. These symptoms may represent a serious problem that is an emergency. Do not wait to see if the symptoms will go away. Get medical help right away. Call your local emergency services (911 in the [place] Do not drive yourself to the hospital. Summary  Follow instructions from your health care provider about how to care for your incision.  Wash your hands with soap and water for at least 20 seconds before and after you change the dressing. If soap and water are not available, use hand sanitizer.  Check your incision area every day for signs of infection.  Keep all follow-up visits as told by your health care provider. This is important. This information is not intended to replace advice given to you by your health care provider. Make sure you discuss any questions you have with your health care provider. Document Revised: 04/23/2019 Document Reviewed: 04/23/2019 Elsevier Patient Education  2021 [employer]   
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
## 2020-09-19 ENCOUNTER — Other Ambulatory Visit: Payer: Self-pay | Admitting: Family Medicine

## 2020-09-19 DIAGNOSIS — F410 Panic disorder [episodic paroxysmal anxiety] without agoraphobia: Secondary | ICD-10-CM

## 2020-09-22 ENCOUNTER — Ambulatory Visit (INDEPENDENT_AMBULATORY_CARE_PROVIDER_SITE_OTHER): Payer: 59 | Admitting: Psychology

## 2020-09-22 DIAGNOSIS — F4310 Post-traumatic stress disorder, unspecified: Secondary | ICD-10-CM

## 2020-09-23 ENCOUNTER — Encounter: Payer: Self-pay | Admitting: Family Medicine

## 2020-09-23 DIAGNOSIS — F411 Generalized anxiety disorder: Secondary | ICD-10-CM

## 2020-09-23 MED ORDER — ALPRAZOLAM 0.5 MG PO TABS: 0.5000 mg | ORAL_TABLET | Freq: Two times a day (BID) | ORAL | 0 refills | Status: DC | PRN

## 2020-09-23 NOTE — Addendum Note (Signed)
Addended by: Waunita Schooner R on: 09/23/2020 04:00 PM   Modules accepted: Orders

## 2020-09-29 ENCOUNTER — Ambulatory Visit (INDEPENDENT_AMBULATORY_CARE_PROVIDER_SITE_OTHER): Payer: 59 | Admitting: Psychology

## 2020-09-29 DIAGNOSIS — F4310 Post-traumatic stress disorder, unspecified: Secondary | ICD-10-CM

## 2020-10-06 ENCOUNTER — Ambulatory Visit (INDEPENDENT_AMBULATORY_CARE_PROVIDER_SITE_OTHER): Payer: 59 | Admitting: Psychology

## 2020-10-06 DIAGNOSIS — F4310 Post-traumatic stress disorder, unspecified: Secondary | ICD-10-CM | POA: Diagnosis not present

## 2020-10-08 ENCOUNTER — Other Ambulatory Visit: Payer: Self-pay

## 2020-10-08 ENCOUNTER — Ambulatory Visit (INDEPENDENT_AMBULATORY_CARE_PROVIDER_SITE_OTHER): Payer: Managed Care, Other (non HMO) | Admitting: Obstetrics and Gynecology

## 2020-10-08 ENCOUNTER — Encounter: Payer: Self-pay | Admitting: Obstetrics and Gynecology

## 2020-10-08 VITALS — BP 124/70 | Ht 68.0 in | Wt 199.2 lb

## 2020-10-08 DIAGNOSIS — Z9889 Other specified postprocedural states: Secondary | ICD-10-CM

## 2020-10-08 NOTE — Progress Notes (Signed)
  Postoperative Follow-up Patient presents post op from Robotic assisted laparoscopic right adnexal cystectomy  for  Right adnexal cyst , 1 month ago.  Subjective: Patient reports marked improvement in her preop symptoms. Eating a regular diet without difficulty. Pain is controlled without any medications.  Activity: normal activities of daily living. Patient reports additional symptom's since surgery of None.  Objective: BP 124/70   Ht 5\' 8"  (1.727 m)   Wt 199 lb 3.2 oz (90.4 kg)   LMP 09/23/2020   BMI 30.29 kg/m  Physical Exam Constitutional:      Appearance: She is well-developed.  Genitourinary:     Genitourinary Comments:    HENT:     Head: Normocephalic and atraumatic.  Neck:     Thyroid: No thyromegaly.  Cardiovascular:     Rate and Rhythm: Normal rate and regular rhythm.     Heart sounds: Normal heart sounds.  Pulmonary:     Effort: Pulmonary effort is normal.     Breath sounds: Normal breath sounds.  Abdominal:     General: Bowel sounds are normal. There is no distension.     Palpations: Abdomen is soft. There is no mass.     Comments: Incisions are intact, clean, and dry  Musculoskeletal:     Cervical back: Neck supple.  Neurological:     Mental Status: She is alert and oriented to person, place, and time.  Skin:    General: Skin is warm and dry.  Psychiatric:        Behavior: Behavior normal.        Thought Content: Thought content normal.        Judgment: Judgment normal.  Vitals reviewed.     Assessment: s/p :  Robotic assisted laparoscopic right adnexal cystectomy  stable  Plan: Patient has done well after surgery with no apparent complications.  I have discussed the post-operative course to date, and the expected progress moving forward.  The patient understands what complications to be concerned about.  I will see the patient in routine follow up, or sooner if needed.    Activity plan: No restriction.  Adrian Prows MD, [facility],  [facility] 10/08/2020 3:07 PM

## 2020-10-13 ENCOUNTER — Ambulatory Visit (INDEPENDENT_AMBULATORY_CARE_PROVIDER_SITE_OTHER): Payer: 59 | Admitting: Psychology

## 2020-10-13 DIAGNOSIS — F4310 Post-traumatic stress disorder, unspecified: Secondary | ICD-10-CM

## 2020-10-20 ENCOUNTER — Ambulatory Visit (INDEPENDENT_AMBULATORY_CARE_PROVIDER_SITE_OTHER): Payer: 59 | Admitting: Psychology

## 2020-10-20 DIAGNOSIS — F4310 Post-traumatic stress disorder, unspecified: Secondary | ICD-10-CM | POA: Diagnosis not present

## 2020-10-27 ENCOUNTER — Ambulatory Visit (INDEPENDENT_AMBULATORY_CARE_PROVIDER_SITE_OTHER): Payer: 59 | Admitting: Psychology

## 2020-10-27 DIAGNOSIS — F4310 Post-traumatic stress disorder, unspecified: Secondary | ICD-10-CM

## 2020-11-03 ENCOUNTER — Ambulatory Visit (INDEPENDENT_AMBULATORY_CARE_PROVIDER_SITE_OTHER): Payer: 59 | Admitting: Psychology

## 2020-11-03 DIAGNOSIS — F4310 Post-traumatic stress disorder, unspecified: Secondary | ICD-10-CM | POA: Diagnosis not present

## 2020-11-10 ENCOUNTER — Ambulatory Visit (INDEPENDENT_AMBULATORY_CARE_PROVIDER_SITE_OTHER): Payer: 59 | Admitting: Psychology

## 2020-11-10 DIAGNOSIS — F4310 Post-traumatic stress disorder, unspecified: Secondary | ICD-10-CM | POA: Diagnosis not present

## 2020-11-17 ENCOUNTER — Ambulatory Visit (INDEPENDENT_AMBULATORY_CARE_PROVIDER_SITE_OTHER): Payer: 59 | Admitting: Psychology

## 2020-11-17 ENCOUNTER — Ambulatory Visit: Payer: 59 | Admitting: Psychology

## 2020-11-17 DIAGNOSIS — F4310 Post-traumatic stress disorder, unspecified: Secondary | ICD-10-CM | POA: Diagnosis not present

## 2020-11-18 ENCOUNTER — Other Ambulatory Visit: Payer: Self-pay

## 2020-11-18 ENCOUNTER — Encounter: Payer: Managed Care, Other (non HMO) | Attending: Psychology | Admitting: Psychology

## 2020-11-18 DIAGNOSIS — F4310 Post-traumatic stress disorder, unspecified: Secondary | ICD-10-CM | POA: Diagnosis not present

## 2020-11-18 DIAGNOSIS — F411 Generalized anxiety disorder: Secondary | ICD-10-CM

## 2020-11-18 DIAGNOSIS — S069X4D Unspecified intracranial injury with loss of consciousness of 6 hours to 24 hours, subsequent encounter: Secondary | ICD-10-CM

## 2020-11-18 DIAGNOSIS — F09 Unspecified mental disorder due to known physiological condition: Secondary | ICD-10-CM | POA: Diagnosis not present

## 2020-11-18 DIAGNOSIS — Z87820 Personal history of traumatic brain injury: Secondary | ICD-10-CM

## 2020-11-24 ENCOUNTER — Ambulatory Visit (INDEPENDENT_AMBULATORY_CARE_PROVIDER_SITE_OTHER): Payer: 59 | Admitting: Psychology

## 2020-11-24 DIAGNOSIS — F4310 Post-traumatic stress disorder, unspecified: Secondary | ICD-10-CM

## 2020-11-30 ENCOUNTER — Encounter: Payer: Self-pay | Admitting: Psychology

## 2020-11-30 NOTE — Progress Notes (Signed)
11/18/2020: 10 AM to 11 AM    Today's visit was an in person visit that was conducted in my outpatient clinic office.  The patient myself were present.  The patient continues to struggle with coping issues around her residual symptoms related to her traumatic brain injury.  The patient continues to have attentional issues and memory issues and we also looked at issues associated with posttraumatic stress disorder.  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  The patient reports that her anxiety has been a little bit better and we have continue to work on systematic desensitization efforts.  We have also been looking at how to look at possible future ketamine infusion.  We began talking about systematic desensitization types of efforts and other possible interventions in the future including possible ketamine infusions.

## 2020-12-01 ENCOUNTER — Ambulatory Visit (INDEPENDENT_AMBULATORY_CARE_PROVIDER_SITE_OTHER): Payer: 59 | Admitting: Psychology

## 2020-12-01 DIAGNOSIS — F4310 Post-traumatic stress disorder, unspecified: Secondary | ICD-10-CM

## 2020-12-08 ENCOUNTER — Ambulatory Visit (INDEPENDENT_AMBULATORY_CARE_PROVIDER_SITE_OTHER): Payer: 59 | Admitting: Psychology

## 2020-12-08 DIAGNOSIS — F4310 Post-traumatic stress disorder, unspecified: Secondary | ICD-10-CM | POA: Diagnosis not present

## 2020-12-15 ENCOUNTER — Ambulatory Visit (INDEPENDENT_AMBULATORY_CARE_PROVIDER_SITE_OTHER): Payer: 59 | Admitting: Psychology

## 2020-12-15 DIAGNOSIS — F4310 Post-traumatic stress disorder, unspecified: Secondary | ICD-10-CM | POA: Diagnosis not present

## 2020-12-22 ENCOUNTER — Ambulatory Visit (INDEPENDENT_AMBULATORY_CARE_PROVIDER_SITE_OTHER): Payer: 59 | Admitting: Psychology

## 2020-12-22 DIAGNOSIS — F4310 Post-traumatic stress disorder, unspecified: Secondary | ICD-10-CM

## 2020-12-29 ENCOUNTER — Ambulatory Visit (INDEPENDENT_AMBULATORY_CARE_PROVIDER_SITE_OTHER): Payer: 59 | Admitting: Psychology

## 2020-12-29 DIAGNOSIS — F4310 Post-traumatic stress disorder, unspecified: Secondary | ICD-10-CM | POA: Diagnosis not present

## 2021-01-04 ENCOUNTER — Ambulatory Visit (INDEPENDENT_AMBULATORY_CARE_PROVIDER_SITE_OTHER): Payer: 59 | Admitting: Psychology

## 2021-01-04 DIAGNOSIS — F4310 Post-traumatic stress disorder, unspecified: Secondary | ICD-10-CM

## 2021-01-05 ENCOUNTER — Ambulatory Visit: Payer: 59 | Admitting: Psychology

## 2021-01-12 ENCOUNTER — Ambulatory Visit (INDEPENDENT_AMBULATORY_CARE_PROVIDER_SITE_OTHER): Payer: 59 | Admitting: Psychology

## 2021-01-12 DIAGNOSIS — F4310 Post-traumatic stress disorder, unspecified: Secondary | ICD-10-CM

## 2021-01-18 ENCOUNTER — Ambulatory Visit: Payer: Managed Care, Other (non HMO) | Admitting: Psychology

## 2021-01-19 ENCOUNTER — Ambulatory Visit: Payer: 59 | Admitting: Psychology

## 2021-01-26 ENCOUNTER — Ambulatory Visit: Payer: 59 | Admitting: Psychology

## 2021-01-27 ENCOUNTER — Encounter: Payer: Self-pay | Admitting: Psychology

## 2021-01-27 ENCOUNTER — Encounter: Payer: Managed Care, Other (non HMO) | Attending: Psychology | Admitting: Psychology

## 2021-01-27 ENCOUNTER — Other Ambulatory Visit: Payer: Self-pay

## 2021-01-27 DIAGNOSIS — S069X4D Unspecified intracranial injury with loss of consciousness of 6 hours to 24 hours, subsequent encounter: Secondary | ICD-10-CM

## 2021-01-27 DIAGNOSIS — F09 Unspecified mental disorder due to known physiological condition: Secondary | ICD-10-CM | POA: Diagnosis not present

## 2021-01-27 DIAGNOSIS — F411 Generalized anxiety disorder: Secondary | ICD-10-CM | POA: Diagnosis not present

## 2021-01-27 DIAGNOSIS — F4310 Post-traumatic stress disorder, unspecified: Secondary | ICD-10-CM | POA: Diagnosis not present
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
## 2021-02-02 ENCOUNTER — Ambulatory Visit (INDEPENDENT_AMBULATORY_CARE_PROVIDER_SITE_OTHER): Payer: 59 | Admitting: Psychology

## 2021-02-02 DIAGNOSIS — F4310 Post-traumatic stress disorder, unspecified: Secondary | ICD-10-CM

## 2021-02-09 ENCOUNTER — Ambulatory Visit (INDEPENDENT_AMBULATORY_CARE_PROVIDER_SITE_OTHER): Payer: 59 | Admitting: Psychology

## 2021-02-09 DIAGNOSIS — F4310 Post-traumatic stress disorder, unspecified: Secondary | ICD-10-CM | POA: Diagnosis not present

## 2021-02-16 ENCOUNTER — Ambulatory Visit (INDEPENDENT_AMBULATORY_CARE_PROVIDER_SITE_OTHER): Payer: 59 | Admitting: Psychology

## 2021-02-16 DIAGNOSIS — F4310 Post-traumatic stress disorder, unspecified: Secondary | ICD-10-CM | POA: Diagnosis not present

## 2021-02-23 ENCOUNTER — Ambulatory Visit (INDEPENDENT_AMBULATORY_CARE_PROVIDER_SITE_OTHER): Payer: 59 | Admitting: Psychology

## 2021-02-23 DIAGNOSIS — F4310 Post-traumatic stress disorder, unspecified: Secondary | ICD-10-CM | POA: Diagnosis not present

## 2021-03-02 ENCOUNTER — Ambulatory Visit (INDEPENDENT_AMBULATORY_CARE_PROVIDER_SITE_OTHER): Payer: 59 | Admitting: Psychology

## 2021-03-02 DIAGNOSIS — F4310 Post-traumatic stress disorder, unspecified: Secondary | ICD-10-CM

## 2021-03-09 ENCOUNTER — Ambulatory Visit (INDEPENDENT_AMBULATORY_CARE_PROVIDER_SITE_OTHER): Payer: 59 | Admitting: Psychology

## 2021-03-09 DIAGNOSIS — F410 Panic disorder [episodic paroxysmal anxiety] without agoraphobia: Secondary | ICD-10-CM | POA: Diagnosis not present

## 2021-03-11 ENCOUNTER — Other Ambulatory Visit: Payer: Self-pay | Admitting: Family Medicine

## 2021-03-11 DIAGNOSIS — F410 Panic disorder [episodic paroxysmal anxiety] without agoraphobia: Secondary | ICD-10-CM

## 2021-03-16 ENCOUNTER — Ambulatory Visit: Payer: 59 | Admitting: Psychology

## 2021-03-29 ENCOUNTER — Encounter: Payer: Self-pay | Admitting: Family Medicine

## 2021-03-29 DIAGNOSIS — F411 Generalized anxiety disorder: Secondary | ICD-10-CM

## 2021-03-30 MED ORDER — ALPRAZOLAM 0.5 MG PO TABS
0.5000 mg | ORAL_TABLET | Freq: Two times a day (BID) | ORAL | 0 refills | Status: DC | PRN
Start: 2021-03-30 — End: 2021-07-14

## 2021-04-01 ENCOUNTER — Other Ambulatory Visit: Payer: Self-pay

## 2021-04-01 ENCOUNTER — Ambulatory Visit (INDEPENDENT_AMBULATORY_CARE_PROVIDER_SITE_OTHER): Payer: Managed Care, Other (non HMO)

## 2021-04-01 DIAGNOSIS — Z23 Encounter for immunization: Secondary | ICD-10-CM | POA: Diagnosis not present

## 2021-05-26 ENCOUNTER — Encounter: Payer: Self-pay | Admitting: Family Medicine

## 2021-06-08 ENCOUNTER — Encounter: Payer: Managed Care, Other (non HMO) | Admitting: Family Medicine

## 2021-06-13 ENCOUNTER — Encounter: Payer: Self-pay | Admitting: Family Medicine

## 2021-06-13 ENCOUNTER — Other Ambulatory Visit: Payer: Self-pay

## 2021-06-13 ENCOUNTER — Telehealth (INDEPENDENT_AMBULATORY_CARE_PROVIDER_SITE_OTHER): Payer: Managed Care, Other (non HMO) | Admitting: Family Medicine

## 2021-06-13 DIAGNOSIS — N926 Irregular menstruation, unspecified: Secondary | ICD-10-CM | POA: Diagnosis not present

## 2021-06-13 MED ORDER — LEVONORGESTREL-ETHINYL ESTRAD 0.1-20 MG-MCG PO TABS: 1.0000 | ORAL_TABLET | Freq: Every day | ORAL | 5 refills | Status: DC
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
## 2021-06-13 NOTE — Assessment & Plan Note (Signed)
Pt is open to re starting her prior OC (0.1-20)  She thinks she can remember to take daily  No worries about STDs -monogamous H/o ovarian cyst  Has gyn f/u planned after jan 1  Long discussion re: way to take OC properly and avoidance of smoking  Risks of blood clots outlined as well as possible side eff Pt aware that this does not prevent stds and condoms should still be used inst that it may take up to 3 months for menses to fall into rhythm or side eff to stop  Adv to call if problems or questions  She agreed to no longer socially vape or smoke

## 2021-06-13 NOTE — Patient Instructions (Signed)
Start the oral contraceptive as directed daily at the same time daily  Use your cell phone to set reminder so you don't miss doses  Follow up as planned for gyn exam and pap   If any side effects or problems let us know   Don't smoke or vape on this medication

## 2021-06-21 NOTE — Telephone Encounter (Signed)
07/23/2020-Education given regarding options for contraception, including IUD placement, condoms. She desires to use condoms.

## 2021-07-07 ENCOUNTER — Encounter: Payer: Self-pay | Admitting: Family Medicine

## 2021-07-14 ENCOUNTER — Other Ambulatory Visit: Payer: Self-pay | Admitting: Family Medicine

## 2021-07-14 DIAGNOSIS — F411 Generalized anxiety disorder: Secondary | ICD-10-CM

## 2021-07-14 MED ORDER — ALPRAZOLAM 0.5 MG PO TABS: 0.5000 mg | ORAL_TABLET | Freq: Two times a day (BID) | ORAL | 0 refills | Status: DC | PRN

## 2021-07-14 NOTE — Telephone Encounter (Signed)
Last refill: 03/30/21 #20 with 0 Last OV: 06/07/20 CPE Next appt: 08/26/21 CPE with Dr. Einar Pheasant

## 2021-07-25 ENCOUNTER — Ambulatory Visit: Payer: Managed Care, Other (non HMO) | Admitting: Obstetrics and Gynecology

## 2021-08-22 ENCOUNTER — Encounter: Payer: Self-pay | Admitting: Family Medicine

## 2021-08-25 ENCOUNTER — Ambulatory Visit: Payer: Managed Care, Other (non HMO) | Admitting: Obstetrics and Gynecology

## 2021-08-26 ENCOUNTER — Other Ambulatory Visit (HOSPITAL_COMMUNITY)
Admission: RE | Admit: 2021-08-26 | Discharge: 2021-08-26 | Disposition: A | Discharge status: Home / Self Care | Payer: Managed Care, Other (non HMO) | Source: Ambulatory Visit | Attending: Family Medicine | Admitting: Family Medicine

## 2021-08-26 ENCOUNTER — Encounter: Payer: Self-pay | Admitting: Family Medicine

## 2021-08-26 ENCOUNTER — Other Ambulatory Visit: Payer: Self-pay

## 2021-08-26 ENCOUNTER — Ambulatory Visit (INDEPENDENT_AMBULATORY_CARE_PROVIDER_SITE_OTHER): Payer: Managed Care, Other (non HMO) | Admitting: Family Medicine

## 2021-08-26 VITALS — BP 110/80 | HR 78 | Temp 98.4°F | Ht 67.5 in | Wt 201.3 lb

## 2021-08-26 DIAGNOSIS — F411 Generalized anxiety disorder: Secondary | ICD-10-CM

## 2021-08-26 DIAGNOSIS — Z87820 Personal history of traumatic brain injury: Secondary | ICD-10-CM

## 2021-08-26 DIAGNOSIS — F410 Panic disorder [episodic paroxysmal anxiety] without agoraphobia: Secondary | ICD-10-CM

## 2021-08-26 DIAGNOSIS — D229 Melanocytic nevi, unspecified: Secondary | ICD-10-CM | POA: Insufficient documentation

## 2021-08-26 DIAGNOSIS — Z124 Encounter for screening for malignant neoplasm of cervix: Secondary | ICD-10-CM | POA: Diagnosis present

## 2021-08-26 DIAGNOSIS — Z0000 Encounter for general adult medical examination without abnormal findings: Secondary | ICD-10-CM | POA: Diagnosis not present

## 2021-08-26 LAB — LIPID PANEL
Cholesterol: 205 mg/dL — ABNORMAL HIGH (ref 0–200)
HDL: 50.1 mg/dL (ref >39.00)
LDL Cholesterol: 123 mg/dL — ABNORMAL HIGH (ref 0–99)
NonHDL: 154.53
Total CHOL/HDL Ratio: 4
Triglycerides: 159 mg/dL — ABNORMAL HIGH (ref 0.0–149.0)
VLDL: 31.8 mg/dL (ref 0.0–40.0)

## 2021-08-26 LAB — COMPREHENSIVE METABOLIC PANEL
ALT: 16 U/L (ref 0–35)
AST: 13 U/L (ref 0–37)
Albumin: 4.3 g/dL (ref 3.5–5.2)
Alkaline Phosphatase: 46 U/L (ref 39–117)
BUN: 12 mg/dL (ref 6–23)
CO2: 28 mEq/L (ref 19–32)
Calcium: 9 mg/dL (ref 8.4–10.5)
Chloride: 104 mEq/L (ref 96–112)
Creatinine, Ser: 0.69 mg/dL (ref 0.40–1.20)
GFR: 118.89 mL/min (ref >60.00)
Glucose, Bld: 78 mg/dL (ref 70–99)
Potassium: 4.1 mEq/L (ref 3.5–5.1)
Sodium: 140 mEq/L (ref 135–145)
Total Bilirubin: 0.9 mg/dL (ref 0.2–1.2)
Total Protein: 6.1 g/dL (ref 6.0–8.3)

## 2021-08-26 LAB — HEMOGLOBIN A1C: Hgb A1c MFr Bld: 4.9 % (ref 4.6–6.5)

## 2021-08-26 LAB — CBC
HCT: 38.5 % (ref 36.0–46.0)
Hemoglobin: 12.8 g/dL (ref 12.0–15.0)
MCHC: 33.2 g/dL (ref 30.0–36.0)
MCV: 84.2 fl (ref 78.0–100.0)
Platelets: 221 10*3/uL (ref 150.0–400.0)
RBC: 4.57 Mil/uL (ref 3.87–5.11)
RDW: 12.9 % (ref 11.5–15.5)
WBC: 7.3 10*3/uL (ref 4.0–10.5)

## 2021-08-26 LAB — TSH: TSH: 2.32 u[IU]/mL (ref 0.35–5.50)

## 2021-08-26 MED ORDER — ESCITALOPRAM OXALATE 10 MG PO TABS: 10.0000 mg | ORAL_TABLET | Freq: Every day | ORAL | 3 refills | Status: DC

## 2021-08-26 NOTE — Assessment & Plan Note (Signed)
Worse. Continues to have brain fog, anxiety is improved with treatment. But also dealing with newer short term memory issues. Has not seen neurology recently and would like a referral to help determine how much may be related to TBI and what support could be done. Appreciate neuro support
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
## 2021-08-26 NOTE — Assessment & Plan Note (Signed)
Referral to derm for skin check

## 2021-08-29 LAB — CYTOLOGY - PAP
Comment: NEGATIVE
Diagnosis: NEGATIVE
High risk HPV: NEGATIVE

## 2021-09-12 ENCOUNTER — Encounter: Payer: Self-pay | Admitting: Family Medicine

## 2021-09-12 DIAGNOSIS — Z87820 Personal history of traumatic brain injury: Secondary | ICD-10-CM

## 2021-09-13 NOTE — Addendum Note (Signed)
Addended by: Lesleigh Noe on: 09/13/2021 08:12 AM   Modules accepted: Orders

## 2021-09-20 ENCOUNTER — Other Ambulatory Visit: Payer: Self-pay | Admitting: Family Medicine

## 2021-09-20 DIAGNOSIS — F411 Generalized anxiety disorder: Secondary | ICD-10-CM

## 2021-09-20 NOTE — Telephone Encounter (Signed)
Last refill: 07/14/21 #20 ?Last OV: 08/26/21 for CPE  ?

## 2021-09-21 ENCOUNTER — Ambulatory Visit: Payer: Managed Care, Other (non HMO)

## 2021-09-21 MED ORDER — ALPRAZOLAM 0.5 MG PO TABS: 0.5000 mg | ORAL_TABLET | Freq: Two times a day (BID) | ORAL | 0 refills | Status: DC | PRN

## 2021-09-27 ENCOUNTER — Other Ambulatory Visit: Payer: Self-pay

## 2021-09-27 ENCOUNTER — Ambulatory Visit: Payer: Managed Care, Other (non HMO) | Attending: Sports Medicine | Admitting: Physical Therapy

## 2021-09-27 ENCOUNTER — Ambulatory Visit: Payer: Managed Care, Other (non HMO) | Admitting: Physical Therapy

## 2021-09-27 DIAGNOSIS — R269 Unspecified abnormalities of gait and mobility: Secondary | ICD-10-CM | POA: Diagnosis present

## 2021-09-27 DIAGNOSIS — R2681 Unsteadiness on feet: Secondary | ICD-10-CM | POA: Insufficient documentation

## 2021-09-27 DIAGNOSIS — Z87820 Personal history of traumatic brain injury: Secondary | ICD-10-CM | POA: Diagnosis not present

## 2021-09-27 DIAGNOSIS — R278 Other lack of coordination: Secondary | ICD-10-CM | POA: Diagnosis not present
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
## 2021-09-28 ENCOUNTER — Encounter: Payer: Self-pay | Admitting: Physical Therapy

## 2021-09-29 ENCOUNTER — Encounter: Payer: Self-pay | Admitting: Physical Therapy

## 2021-09-29 ENCOUNTER — Other Ambulatory Visit: Payer: Self-pay

## 2021-09-29 ENCOUNTER — Ambulatory Visit: Payer: Managed Care, Other (non HMO) | Admitting: Physical Therapy

## 2021-09-29 DIAGNOSIS — R278 Other lack of coordination: Secondary | ICD-10-CM | POA: Diagnosis not present
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
## 2021-10-04 ENCOUNTER — Ambulatory Visit: Payer: Managed Care, Other (non HMO) | Admitting: Physical Therapy

## 2021-10-04 ENCOUNTER — Other Ambulatory Visit: Payer: Self-pay

## 2021-10-04 ENCOUNTER — Ambulatory Visit: Payer: Managed Care, Other (non HMO)

## 2021-10-04 DIAGNOSIS — R269 Unspecified abnormalities of gait and mobility: Secondary | ICD-10-CM

## 2021-10-04 DIAGNOSIS — R278 Other lack of coordination: Secondary | ICD-10-CM

## 2021-10-04 DIAGNOSIS — R2681 Unsteadiness on feet: Secondary | ICD-10-CM
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
## 2021-10-06 ENCOUNTER — Ambulatory Visit: Payer: Managed Care, Other (non HMO) | Admitting: Physical Therapy

## 2021-10-07 ENCOUNTER — Other Ambulatory Visit: Payer: Self-pay

## 2021-10-07 ENCOUNTER — Ambulatory Visit: Payer: Managed Care, Other (non HMO)

## 2021-10-07 ENCOUNTER — Encounter: Payer: Self-pay | Admitting: Obstetrics and Gynecology

## 2021-10-07 ENCOUNTER — Ambulatory Visit (INDEPENDENT_AMBULATORY_CARE_PROVIDER_SITE_OTHER): Payer: Managed Care, Other (non HMO) | Admitting: Obstetrics and Gynecology

## 2021-10-07 VITALS — BP 100/70 | Ht 68.0 in | Wt 199.0 lb

## 2021-10-07 DIAGNOSIS — Z01419 Encounter for gynecological examination (general) (routine) without abnormal findings: Secondary | ICD-10-CM | POA: Diagnosis not present

## 2021-10-07 DIAGNOSIS — Z3041 Encounter for surveillance of contraceptive pills: Secondary | ICD-10-CM

## 2021-10-07 DIAGNOSIS — R269 Unspecified abnormalities of gait and mobility: Secondary | ICD-10-CM

## 2021-10-07 DIAGNOSIS — R278 Other lack of coordination: Secondary | ICD-10-CM | POA: Diagnosis not present

## 2021-10-07 DIAGNOSIS — R2681 Unsteadiness on feet: Secondary | ICD-10-CM

## 2021-10-07 MED ORDER — LEVONORGESTREL-ETHINYL ESTRAD 0.1-20 MG-MCG PO TABS: 1.0000 | ORAL_TABLET | Freq: Every day | ORAL | 12 refills | Status: DC

## 2021-10-07 NOTE — Patient Instructions (Signed)
Institute of Medicine Recommended Dietary Allowances for Calcium and Vitamin D  ?Age ?(yr) Calcium ?Recommended ?Dietary Allowance ?(mg/day) Vitamin D ?Recommended ?Dietary Allowance ?(international units/day)  ?9-18 1,300 600  ?19-50 1,000 600  ?51-70 1,200 600  ?71 and older 1,200 800  ?Data from [place]. Dietary reference intakes: calcium, vitamin D. [place], [place]: [employer]; 2011.  ? Exercising to Stay Healthy ?To become healthy and stay healthy, it is recommended that you do moderate-intensity and vigorous-intensity exercise. You can tell that you are exercising at a moderate intensity if your heart starts beating faster and you start breathing faster but can still hold a conversation. You can tell that you are exercising at a vigorous intensity if you are breathing much harder and faster and cannot hold a conversation while exercising. ?How can exercise benefit me? ?Exercising regularly is important. It has many health benefits, such as: ?Improving overall fitness, flexibility, and endurance. ?Increasing bone density. ?Helping with weight control. ?Decreasing body fat. ?Increasing muscle strength and endurance. ?Reducing stress and tension, anxiety, depression, or anger. ?Improving overall health. ?What guidelines should I follow while exercising? ?Before you start a new exercise program, talk with your health care provider. ?Do not exercise so much that you hurt yourself, feel dizzy, or get very short of breath. ?Wear comfortable clothes and wear shoes with good support. ?Drink plenty of water while you exercise to prevent dehydration or heat stroke. ?Work out until your breathing and your heartbeat get faster (moderate intensity). ?How often should I exercise? ?Choose an activity that you enjoy, and set realistic goals. Your health care provider can help you make an activity plan that is individually designed and works best for you. ?Exercise regularly as told by your health  care provider. This may include: ?Doing strength training two times a week, such as: ?Lifting weights. ?Using resistance bands. ?Push-ups. ?Sit-ups. ?Yoga. ?Doing a certain intensity of exercise for a given amount of time. Choose from these options: ?A total of 150 minutes of moderate-intensity exercise every week. ?A total of 75 minutes of vigorous-intensity exercise every week. ?A mix of moderate-intensity and vigorous-intensity exercise every week. ?Children, pregnant women, people who have not exercised regularly, people who are overweight, and older adults may need to talk with a health care provider about what activities are safe to perform. If you have a medical condition, be sure to talk with your health care provider before you start a new exercise program. ?What are some exercise ideas? ?Moderate-intensity exercise ideas include: ?Walking 1 mile (1.6 km) in about 15 minutes. ?Biking. ?Hiking. ?Golfing. ?Dancing. ?Water aerobics. ?Vigorous-intensity exercise ideas include: ?Walking 4.5 miles (7.2 km) or more in about 1 hour. ?Jogging or running 5 miles (8 km) in about 1 hour. ?Biking 10 miles (16.1 km) or more in about 1 hour. ?Lap swimming. ?Roller-skating or in-line skating. ?Cross-country skiing. ?Vigorous competitive sports, such as football, basketball, and soccer. ?Jumping rope. ?Aerobic dancing. ?What are some everyday activities that can help me get exercise? ?[place] work, such as: ?Pushing a Conservation officer, nature. ?Raking and bagging leaves. ?Washing your car. ?Pushing a stroller. ?Shoveling snow. ?Gardening. ?Washing windows or floors. ?How can I be more active in my day-to-day activities? ?Use stairs instead of an elevator. ?Take a walk during your lunch break. ?If you drive, park your car farther away from your work or school. ?If you take public transportation, get off one stop early and walk the rest of the way. ?Stand up or walk around during all of  your indoor phone calls. ?Get up, stretch, and walk  around every 30 minutes throughout the day. ?Enjoy exercise with a friend. Support to continue exercising will help you keep a regular routine of activity. ?Where to find more information ?You can find more information about exercising to stay healthy from: ?U.S. Department of Health and Human Services: [employer].at ?Centers for Disease Control and Prevention (CDC): http://www.wolf.info/ ?Summary ?Exercising regularly is important. It will improve your overall fitness, flexibility, and endurance. ?Regular exercise will also improve your overall health. It can help you control your weight, reduce stress, and improve your bone density. ?Do not exercise so much that you hurt yourself, feel dizzy, or get very short of breath. ?Before you start a new exercise program, talk with your health care provider. ?This information is not intended to replace advice given to you by your health care provider. Make sure you discuss any questions you have with your health care provider. ?Document Revised: 10/29/2020 Document Reviewed: 10/29/2020 ?Elsevier Patient Education ? [place]. ?Budget-Friendly Healthy Eating ?There are many ways to save money at the grocery store and continue to eat healthy. You can be successful if you: ?Plan meals according to your budget. ?Make a grocery list and only purchase food according to your grocery list. ?Prepare food yourself at home. ?What are tips for following this plan? ?Reading food labels ?Compare food labels between brand name foods and the store brand. Often the nutritional value is the same, but the store brand is lower cost. ?Look for products that do not have added sugar, fat, or salt (sodium). These often cost the same but are healthier for you. Products may be labeled as: ?Sugar-free. ?Nonfat. ?Low-fat. ?Sodium-free. ?Low-sodium. ?Look for lean ground beef labeled as at least 92% lean and 8% fat. ?Shopping ? ?Buy only the items on your grocery list and go only to the areas of the store  that have the items on your list. ?Use coupons only for foods and brands you normally buy. Avoid buying items you wouldn't normally buy simply because they are on sale. ?Check online and in newspapers for weekly deals. ?Buy healthy items from the bulk bins when available, such as herbs, spices, flour, pasta, nuts, and dried fruit. ?Buy fruits and vegetables that are in season. Prices are usually lower on in-season produce. ?Look at the unit price on the price tag. Use it to compare different brands and sizes to find out which item is the best deal. ?Choose healthy items that are often low-cost, such as carrots, potatoes, apples, bananas, and oranges. Dried or canned beans are a low-cost protein source. ?Buy in bulk and freeze extra food. Items you can buy in bulk include meats, fish, poultry, frozen fruits, and frozen vegetables. ?Avoid buying "ready-to-eat" foods, such as pre-cut fruits and vegetables and pre-made salads. ?If possible, shop around to discover where you can find the best prices. Consider other retailers such as dollar stores, larger [employer], local fruit and vegetable stands, and farmers markets. ?Do not shop when you are hungry. If you shop while hungry, it may be hard to stick to your list and budget. ?Resist impulse buying. Use your grocery list as your official plan for the week. ?Buy a variety of vegetables and fruits by purchasing fresh, frozen, and canned items. ?Look at the top and bottom shelves for deals. Foods at eye level (eye level of an adult or child) are usually more expensive. ?Be efficient with your time when shopping. The more time you  spend at the store, the more money you are likely to spend. ?To save money when choosing more expensive foods like meats and dairy: ?Choose cheaper cuts of meat, such as bone-in chicken thighs and drumsticks instead of skinless and boneless chicken. When you are ready to prepare the chicken, you can remove the skin yourself to make it  healthier. ?Choose lean meats like chicken or [place] instead of beef. ?Choose canned seafood, such as tuna, salmon, or sardines. ?Buy eggs as a low-cost source of protein. ?Buy dried beans and peas, such as le
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
## 2021-10-10 ENCOUNTER — Other Ambulatory Visit: Payer: Self-pay

## 2021-10-10 ENCOUNTER — Telehealth: Payer: Self-pay

## 2021-10-10 ENCOUNTER — Ambulatory Visit: Payer: Managed Care, Other (non HMO)

## 2021-10-10 DIAGNOSIS — R278 Other lack of coordination: Secondary | ICD-10-CM

## 2021-10-10 DIAGNOSIS — R269 Unspecified abnormalities of gait and mobility: Secondary | ICD-10-CM
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
## 2021-10-10 NOTE — Telephone Encounter (Signed)
Called pt after coordinating with [employer] in [place]. Author contacted shoe store to give recommendations on a shoe with 8-78m drop, moderate to high heel rocker and toe rockers, and need to fit pt for sizing. [employer] confirmed shoes in stock that meet that criteria, provided a name of shoe sitter that will be on-duty at pt's desired time of visit. Author passed along these details on pt via VM.  ? ?11:16 AM, 10/10/21 ?AEtta Grandchild PT, DPT ?Physical Therapist - [place]?[phone](Office) ? ?

## 2021-10-11 ENCOUNTER — Ambulatory Visit: Payer: Managed Care, Other (non HMO) | Admitting: Physical Therapy

## 2021-10-13 ENCOUNTER — Ambulatory Visit: Payer: Managed Care, Other (non HMO)

## 2021-10-13 DIAGNOSIS — R2681 Unsteadiness on feet: Secondary | ICD-10-CM

## 2021-10-13 DIAGNOSIS — R269 Unspecified abnormalities of gait and mobility: Secondary | ICD-10-CM

## 2021-10-13 DIAGNOSIS — R278 Other lack of coordination: Secondary | ICD-10-CM | POA: Diagnosis not present
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
## 2021-10-17 ENCOUNTER — Encounter: Payer: Managed Care, Other (non HMO) | Admitting: Physical Therapy

## 2021-10-18 ENCOUNTER — Ambulatory Visit: Payer: Managed Care, Other (non HMO)

## 2021-10-20 ENCOUNTER — Ambulatory Visit: Payer: Managed Care, Other (non HMO)

## 2021-10-20 ENCOUNTER — Ambulatory Visit: Payer: Managed Care, Other (non HMO) | Attending: Sports Medicine

## 2021-10-20 DIAGNOSIS — R278 Other lack of coordination: Secondary | ICD-10-CM | POA: Insufficient documentation

## 2021-10-20 DIAGNOSIS — R269 Unspecified abnormalities of gait and mobility: Secondary | ICD-10-CM | POA: Diagnosis present

## 2021-10-20 DIAGNOSIS — R2681 Unsteadiness on feet: Secondary | ICD-10-CM | POA: Diagnosis present
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
## 2021-10-24 ENCOUNTER — Ambulatory Visit: Payer: Managed Care, Other (non HMO) | Admitting: Physical Therapy

## 2021-10-25 ENCOUNTER — Ambulatory Visit: Payer: Managed Care, Other (non HMO) | Admitting: Physical Therapy

## 2021-10-26 ENCOUNTER — Telehealth: Payer: Self-pay

## 2021-10-26 NOTE — Telephone Encounter (Signed)
Pt did not arrive for scheduled visit, nor was communication received. Author reached out via secure VM. Pt returned call to clinic later in day, apologized for absence, reported to have forgotten about appointment after a busy weekend with travel. Pt aware of next scheduled visit.  ? ?4:07 PM, 10/26/21 ?Etta Grandchild, PT, DPT ?Physical Therapist - [place] ?[phone] (Office) ? ?

## 2021-10-27 ENCOUNTER — Ambulatory Visit: Payer: Managed Care, Other (non HMO)

## 2021-10-27 DIAGNOSIS — R278 Other lack of coordination: Secondary | ICD-10-CM

## 2021-10-27 DIAGNOSIS — R269 Unspecified abnormalities of gait and mobility: Secondary | ICD-10-CM

## 2021-10-27 DIAGNOSIS — R2681 Unsteadiness on feet: Secondary | ICD-10-CM
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
## 2021-10-31 ENCOUNTER — Encounter: Payer: Self-pay | Admitting: Physical Therapy

## 2021-10-31 ENCOUNTER — Ambulatory Visit: Payer: Managed Care, Other (non HMO) | Admitting: Physical Therapy

## 2021-10-31 DIAGNOSIS — R278 Other lack of coordination: Secondary | ICD-10-CM

## 2021-10-31 DIAGNOSIS — R269 Unspecified abnormalities of gait and mobility: Secondary | ICD-10-CM

## 2021-10-31 DIAGNOSIS — R2681 Unsteadiness on feet: Secondary | ICD-10-CM
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
## 2021-11-01 ENCOUNTER — Ambulatory Visit: Payer: Managed Care, Other (non HMO)

## 2021-11-04 ENCOUNTER — Encounter: Payer: Self-pay | Admitting: Physical Therapy

## 2021-11-04 ENCOUNTER — Ambulatory Visit: Payer: Managed Care, Other (non HMO) | Admitting: Physical Therapy

## 2021-11-04 DIAGNOSIS — R278 Other lack of coordination: Secondary | ICD-10-CM | POA: Diagnosis not present

## 2021-11-04 DIAGNOSIS — R2681 Unsteadiness on feet: Secondary | ICD-10-CM

## 2021-11-04 DIAGNOSIS — R269 Unspecified abnormalities of gait and mobility: Secondary | ICD-10-CM
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
## 2021-11-07 ENCOUNTER — Ambulatory Visit (INDEPENDENT_AMBULATORY_CARE_PROVIDER_SITE_OTHER): Payer: Managed Care, Other (non HMO) | Admitting: Family Medicine

## 2021-11-07 ENCOUNTER — Encounter: Payer: Self-pay | Admitting: Family Medicine

## 2021-11-07 ENCOUNTER — Encounter: Payer: Self-pay | Admitting: Physical Therapy

## 2021-11-07 ENCOUNTER — Ambulatory Visit: Payer: Managed Care, Other (non HMO) | Admitting: Physical Therapy

## 2021-11-07 VITALS — BP 100/62 | HR 73 | Temp 98.1°F | Ht 67.5 in | Wt 199.5 lb

## 2021-11-07 DIAGNOSIS — F411 Generalized anxiety disorder: Secondary | ICD-10-CM

## 2021-11-07 DIAGNOSIS — Z87820 Personal history of traumatic brain injury: Secondary | ICD-10-CM | POA: Diagnosis not present

## 2021-11-07 DIAGNOSIS — S0990XS Unspecified injury of head, sequela: Secondary | ICD-10-CM | POA: Diagnosis not present

## 2021-11-07 DIAGNOSIS — R278 Other lack of coordination: Secondary | ICD-10-CM | POA: Diagnosis not present

## 2021-11-07 DIAGNOSIS — R2681 Unsteadiness on feet: Secondary | ICD-10-CM

## 2021-11-07 DIAGNOSIS — R2689 Other abnormalities of gait and mobility: Secondary | ICD-10-CM | POA: Diagnosis not present

## 2021-11-07 DIAGNOSIS — R269 Unspecified abnormalities of gait and mobility: Secondary | ICD-10-CM

## 2021-11-07 NOTE — Assessment & Plan Note (Signed)
Improved after course of physical therapy.  Continue with home exercise routine and slowly working up physical activity. ?

## 2021-11-07 NOTE — Assessment & Plan Note (Signed)
Due to increasing irritability discussed that she could increase Lexapro from 10 to 15 mg.  However she previously did not do well on higher doses so she will consider this.  Continue as needed Xanax for severe symptoms. ?

## 2021-11-07 NOTE — Patient Instructions (Addendum)
#Referral ?I have placed a referral to a specialist for you. You should receive a phone call from the specialty office. Make sure your voicemail is not full and that if you are able to answer your phone to unknown or new numbers.  ? ?It may take up to 2 weeks to hear about the referral. If you do not hear anything in 2 weeks, please call our office and ask to speak with the referral coordinator.  ? ?Continue therapy ?Can consider lexapro increase - to 1.5 mg ? ?Buspirone Tablets ?What is this medication? ?BUSPIRONE (byoo SPYE rone) treats anxiety. It works by balancing the levels of dopamine and serotonin in your brain, hormones that help regulate mood. ?This medicine may be used for other purposes; ask your health care provider or pharmacist if you have questions. ?COMMON BRAND NAME(S): BuSpar, Buspar Dividose ?What should I tell my care team before I take this medication? ?They need to know if you have any of these conditions: ?Kidney or liver disease ?An unusual or allergic reaction to buspirone, other medications, foods, dyes, or preservatives ?Pregnant or trying to get pregnant ?Breast-feeding ?How should I use this medication? ?Take this medication by mouth with a glass of water. Follow the directions on the prescription label. You may take this medication with or without food. To ensure that this medication always works the same way for you, you should take it either always with or always without food. Take your doses at regular intervals. Do not take your medication more often than directed. Do not stop taking except on the advice of your care team. ?Talk to your care team about the use of this medication in children. Special care may be needed. ?Overdosage: If you think you have taken too much of this medicine contact a poison control center or emergency room at once. ?NOTE: This medicine is only for you. Do not share this medicine with others. ?What if I miss a dose? ?If you miss a dose, take it as soon as  you can. If it is almost time for your next dose, take only that dose. Do not take double or extra doses. ?What may interact with this medication? ?Do not take this medication with any of the following: ?Linezolid ?MAOIs like Carbex, Eldepryl, Marplan, Nardil, and Parnate ?Methylene blue ?Procarbazine ?This medication may also interact with the following: ?Diazepam ?Digoxin ?Diltiazem ?Erythromycin ?Grapefruit juice ?Haloperidol ?Medications for mental depression or mood problems ?Medications for seizures like carbamazepine, phenobarbital and phenytoin ?Nefazodone ?Other medications for anxiety ?Rifampin ?Ritonavir ?Some antifungal medications like itraconazole, ketoconazole, and voriconazole ?Verapamil ?Warfarin ?This list may not describe all possible interactions. Give your health care provider a list of all the medicines, herbs, non-prescription drugs, or dietary supplements you use. Also tell them if you smoke, drink alcohol, or use illegal drugs. Some items may interact with your medicine. ?What should I watch for while using this medication? ?Visit your care team for regular checks on your progress. It may take 1 to 2 weeks before your anxiety gets better. ?You may get drowsy or dizzy. Do not drive, use machinery, or do anything that needs mental alertness until you know how this medication affects you. Do not stand or sit up quickly, especially if you are an older patient. This reduces the risk of dizzy or fainting spells. Alcohol can make you more drowsy and dizzy. Avoid alcoholic drinks. ?What side effects may I notice from receiving this medication? ?Side effects that you should report to your care team  as soon as possible: ?Allergic reactions--skin rash, itching, hives, swelling of the face, lips, tongue, or throat ?Irritability, confusion, fast or irregular heartbeat, muscle stiffness, twitching muscles, sweating, high fever, seizure, chills, vomiting, diarrhea, which may be signs of serotonin  syndrome ?Side effects that usually do not require medical attention (report to your care team if they continue or are bothersome): ?Anxiety or nervousness ?Dizziness ?Drowsiness ?Headache ?Nausea ?Trouble sleeping ?This list may not describe all possible side effects. Call your doctor for medical advice about side effects. You may report side effects to FDA at 1-800-FDA-1088. ?Where should I keep my medication? ?Keep out of the reach of children. ?Store at room temperature below 30 degrees C (86 degrees F). Protect from light. Keep container tightly closed. Throw away any unused medication after the expiration date. ?NOTE: This sheet is a summary. It may not cover all possible information. If you have questions about this medicine, talk to your doctor, pharmacist, or health care provider. ?? 2023 Elsevier/Gold Standard (2020-09-30 00:00:00) ? ?

## 2021-11-07 NOTE — Assessment & Plan Note (Signed)
Complicated by persistent mood changes including irritability.  Continue therapy and plan to start couples therapy.  Do feel patient would benefit from psych evaluation given complexity and underlying anxiety.  For consideration for additional medication for irritability and agitation.  Also discussed case with Dr. Lorelei Pont who is willing to see patient to consider additional medication while patient waits for psych evaluation. ?

## 2021-11-07 NOTE — Progress Notes (Signed)
? ?Subjective:  ? ?  ?Monica Forbes is a 26 y.o. female presenting for Follow-up (TBI symptoms and finishing PT) ?  ? ? ?HPI ? ?#TBI ?- is getting discharged from PT ?- is dong well with running and jogging and interval training ?- planning to return to physical trainer ?- still feels weak and unsteady - working on ankle strength and quad strengthening ?- feeling overall well ? ?- continues talk therapy with BH ?- doing mindfulness practice, beta-wave music ?- tries to do non-medication approaches first ?- does have xanax which is helpful ?- doing brain injury support group ?- issues with impulse control and emotional management ?- has some days - where it is hard to get out of bed ?- some days where she is non-stop and wants to do everything ?- has some days in-between ?- interpersonal relationships - have been challenging ?- irritability and agitation, short fuse ?- easily annoyed ?- small trigger with big reaction  ?- more self aware now so she is aware of the behavior more ?- people had noticed but she didn't realize ? ?Does not remember a lot of her early treatment ?- husband notes he recalls them trying antipsychotics in the hospital and she didn't do well  ? ? ? ?Review of Systems ? ? ?Social History  ? ?Tobacco Use  ?Smoking Status Never  ?Smokeless Tobacco Never  ? ? ? ?   ?Objective:  ?  ?BP Readings from Last 3 Encounters:  ?11/07/21 100/62  ?10/07/21 100/70  ?08/26/21 110/80  ? ?Wt Readings from Last 3 Encounters:  ?11/07/21 199 lb 8 oz (90.5 kg)  ?10/07/21 199 lb (90.3 kg)  ?08/26/21 201 lb 5 oz (91.3 kg)  ? ? ?BP 100/62   Pulse 73   Temp 98.1 ?F (36.7 ?C) (Temporal)   Ht 5' 7.5" (1.715 m)   Wt 199 lb 8 oz (90.5 kg)   LMP 10/23/2021 (Exact Date)   SpO2 99%   BMI 30.78 kg/m?  ? ? ?Physical Exam ?Constitutional:   ?   General: She is not in acute distress. ?   Appearance: She is well-developed. She is not diaphoretic.  ?HENT:  ?   Right Ear: External ear normal.  ?   Left Ear: External ear normal.   ?   Nose: Nose normal.  ?Eyes:  ?   Conjunctiva/sclera: Conjunctivae normal.  ?Cardiovascular:  ?   Rate and Rhythm: Normal rate.  ?Pulmonary:  ?   Effort: Pulmonary effort is normal.  ?Musculoskeletal:  ?   Cervical back: Neck supple.  ?Skin: ?   General: Skin is warm and dry.  ?   Capillary Refill: Capillary refill takes less than 2 seconds.  ?Neurological:  ?   Mental Status: She is alert. Mental status is at baseline.  ?Psychiatric:     ?   Mood and Affect: Mood normal.     ?   Behavior: Behavior normal.  ? ? ? ? ? ?   ?Assessment & Plan:  ? ?Problem List Items Addressed This Visit   ? ?  ? Nervous and Auditory  ? History of traumatic brain injury  ?  Complicated by persistent mood changes including irritability.  Continue therapy and plan to start couples therapy.  Do feel patient would benefit from psych evaluation given complexity and underlying anxiety.  For consideration for additional medication for irritability and agitation.  Also discussed case with Dr. Lorelei Pont who is willing to see patient to consider additional medication while patient  waits for psych evaluation. ? ?  ?  ? Relevant Orders  ? Ambulatory referral to Psychiatry  ?  ? Other  ? 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  Improved after course of physical therapy.  Continue with home exercise routine and slowly working up physical activity. ? ?  ?  ? Generalized anxiety disorder - Primary  ?  Due to increasing irritability discussed that she could increase Lexapro from 10 to 15 mg.  However she previously did not do well on higher doses so she will consider this.  Continue as needed Xanax for severe symptoms. ? ?  ?  ? Relevant Orders  ? Ambulatory referral to Psychiatry  ? ? ?I spent 31 minutes with pt , obtaining history, examining, reviewing chart, documenting encounter and discussing the above plan of care. ? ? ? ?Return if symptoms worsen or fail to improve. ? ?Lesleigh Noe, MD ? ? ? ?
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
## 2021-11-08 MED ORDER — ALPRAZOLAM 0.5 MG PO TABS: 0.5000 mg | ORAL_TABLET | Freq: Two times a day (BID) | ORAL | 0 refills | Status: DC | PRN

## 2021-11-11 ENCOUNTER — Encounter: Payer: Self-pay | Admitting: Physical Therapy

## 2021-11-11 ENCOUNTER — Ambulatory Visit: Payer: Managed Care, Other (non HMO) | Admitting: Physical Therapy

## 2021-11-11 DIAGNOSIS — R2681 Unsteadiness on feet: Secondary | ICD-10-CM

## 2021-11-11 DIAGNOSIS — R278 Other lack of coordination: Secondary | ICD-10-CM

## 2021-11-11 DIAGNOSIS — R269 Unspecified abnormalities of gait and mobility: Secondary | ICD-10-CM
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
## 2021-11-16 ENCOUNTER — Encounter: Payer: Self-pay | Admitting: *Deleted

## 2021-11-17 ENCOUNTER — Encounter: Payer: Self-pay | Admitting: Family Medicine

## 2021-12-05 ENCOUNTER — Encounter: Payer: Self-pay | Admitting: Family Medicine

## 2022-01-09 ENCOUNTER — Other Ambulatory Visit: Payer: Self-pay | Admitting: Family Medicine

## 2022-01-09 DIAGNOSIS — F411 Generalized anxiety disorder: Secondary | ICD-10-CM

## 2022-01-10 MED ORDER — ALPRAZOLAM 0.5 MG PO TABS: 0.5000 mg | ORAL_TABLET | Freq: Two times a day (BID) | ORAL | 0 refills | Status: DC | PRN

## 2022-02-02 ENCOUNTER — Emergency Department: Payer: Managed Care, Other (non HMO)

## 2022-02-02 ENCOUNTER — Telehealth: Payer: Self-pay | Admitting: Family Medicine

## 2022-02-02 ENCOUNTER — Encounter: Payer: Self-pay | Admitting: Emergency Medicine

## 2022-02-02 ENCOUNTER — Emergency Department
Admission: EM | Admit: 2022-02-02 | Discharge: 2022-02-02 | Disposition: A | Discharge status: Home / Self Care | Payer: Managed Care, Other (non HMO) | Attending: Emergency Medicine | Admitting: Emergency Medicine

## 2022-02-02 ENCOUNTER — Encounter: Payer: Self-pay | Admitting: Family Medicine

## 2022-02-02 ENCOUNTER — Other Ambulatory Visit: Payer: Self-pay

## 2022-02-02 DIAGNOSIS — R109 Unspecified abdominal pain: Secondary | ICD-10-CM | POA: Diagnosis present

## 2022-02-02 DIAGNOSIS — N83292 Other ovarian cyst, left side: Secondary | ICD-10-CM | POA: Diagnosis not present

## 2022-02-02 DIAGNOSIS — N83209 Unspecified ovarian cyst, unspecified side: Secondary | ICD-10-CM

## 2022-02-02 LAB — COMPREHENSIVE METABOLIC PANEL
ALT: 20 U/L (ref 0–44)
AST: 23 U/L (ref 15–41)
Albumin: 4.2 g/dL (ref 3.5–5.0)
Alkaline Phosphatase: 50 U/L (ref 38–126)
Anion gap: 10 (ref 5–15)
BUN: 14 mg/dL (ref 6–20)
CO2: 22 mmol/L (ref 22–32)
Calcium: 9.1 mg/dL (ref 8.9–10.3)
Chloride: 107 mmol/L (ref 98–111)
Creatinine, Ser: 0.74 mg/dL (ref 0.44–1.00)
GFR, Estimated: >60 mL/min (ref >60)
Glucose, Bld: 86 mg/dL (ref 70–99)
Potassium: 4 mmol/L (ref 3.5–5.1)
Sodium: 139 mmol/L (ref 135–145)
Total Bilirubin: 0.9 mg/dL (ref 0.3–1.2)
Total Protein: 7.4 g/dL (ref 6.5–8.1)

## 2022-02-02 LAB — URINALYSIS, ROUTINE W REFLEX MICROSCOPIC
Bilirubin Urine: NEGATIVE
Glucose, UA: NEGATIVE mg/dL
Hgb urine dipstick: NEGATIVE
Ketones, ur: NEGATIVE mg/dL
Nitrite: NEGATIVE
Protein, ur: NEGATIVE mg/dL
Specific Gravity, Urine: 1.018 (ref 1.005–1.030)
pH: 6 (ref 5.0–8.0)

## 2022-02-02 LAB — CBC
HCT: 41 % (ref 36.0–46.0)
Hemoglobin: 13.4 g/dL (ref 12.0–15.0)
MCH: 28.5 pg (ref 26.0–34.0)
MCHC: 32.7 g/dL (ref 30.0–36.0)
MCV: 87 fL (ref 80.0–100.0)
Platelets: 238 10*3/uL (ref 150–400)
RBC: 4.71 MIL/uL (ref 3.87–5.11)
RDW: 12.4 % (ref 11.5–15.5)
WBC: 9.5 10*3/uL (ref 4.0–10.5)
nRBC: 0 % (ref 0.0–0.2)

## 2022-02-02 LAB — LIPASE, BLOOD: Lipase: 27 U/L (ref 11–51)

## 2022-02-02 LAB — POC URINE PREG, ED: Preg Test, Ur: NEGATIVE

## 2022-02-02 MED ORDER — LACTATED RINGERS IV BOLUS
1000.0000 mL | Freq: Once | INTRAVENOUS | Status: AC
  Administered 2022-02-02: 1000 mL via INTRAVENOUS

## 2022-02-02 MED ORDER — ONDANSETRON HCL 4 MG/2ML IJ SOLN
4.0000 mg | Freq: Once | INTRAMUSCULAR | Status: AC
  Administered 2022-02-02: 4 mg via INTRAVENOUS
  Filled 2022-02-02: qty 2

## 2022-02-02 MED ORDER — IOHEXOL 300 MG/ML  SOLN
100.0000 mL | Freq: Once | INTRAMUSCULAR | Status: AC | PRN
  Administered 2022-02-02: 100 mL via INTRAVENOUS

## 2022-02-02 MED ORDER — MORPHINE SULFATE (PF) 4 MG/ML IV SOLN
4.0000 mg | Freq: Once | INTRAVENOUS | Status: AC
  Administered 2022-02-02: 4 mg via INTRAVENOUS
  Filled 2022-02-02: qty 1

## 2022-02-02 MED ORDER — KETOROLAC TROMETHAMINE 30 MG/ML IJ SOLN
15.0000 mg | Freq: Once | INTRAMUSCULAR | Status: AC
  Administered 2022-02-02: 15 mg via INTRAVENOUS
  Filled 2022-02-02: qty 1

## 2022-02-02 MED ORDER — IOHEXOL 350 MG/ML SOLN: 100.0000 mL | Freq: Once | INTRAVENOUS | Status: DC | PRN

## 2022-02-02 NOTE — ED Notes (Signed)
Pt reports RLQ pain that has been intermittent since yesterday. Pt in NAD and reports laying flat provides more relief.

## 2022-02-02 NOTE — Telephone Encounter (Signed)
Patient called in stating she is having a sharp pain in her lower abdomen on the right side and was wondering if she could take Tylenol. Stated that when she had a bowel movement it is painful and sore, but no fever. Sent over to triage.

## 2022-02-02 NOTE — ED Triage Notes (Signed)
Patient arrives ambulatory by POV c/o waking up this morning with right upper quadrant abdominal pain. Pain worse with movement. Had BM this morning with no improvement.

## 2022-02-02 NOTE — Telephone Encounter (Signed)
Noted, agree with need for evaluation. Will f/u ER note

## 2022-02-02 NOTE — Telephone Encounter (Signed)
[facility] - Client TELEPHONE ADVICE RECORD AccessNurse Patient Name: Monica Forbes A Gender: Female DOB: Oct 01, 1995 Age: 26 Y 66 M Return Phone Number: [phone] (Primary), [phone] (Secondary) Address: City/ State/ Zip: [place]  [address] Client [facility] - Client Client Site [place] - Day Provider Waunita Schooner- MD Contact Type Call Who Is Calling Patient / Member / Family / Caregiver Call Type Triage / Clinical Relationship To Patient Self Return Phone Number [phone] (Secondary) Chief Complaint SEVERE ABDOMINAL PAIN - Severe pain in abdomen Reason for Call Symptomatic / Request for [place] states she is having a very sharp severe pain in her abdominal and it hurts to use the bathroom and have a bowel movement. The pain is on right side and diagonal from belly button. [place] Not Listed UC , university Translation No Nurse Assessment Nurse: Rolin Barry, RN, Levada Dy Date/Time (Eastern Time): 02/02/2022 8:15:38 AM Confirm and document reason for call. If symptomatic, describe symptoms. ---Caller states she is having a very sharp severe pain in her abdominal and it hurts to use the bathroom and have a bowel movement. The pain is on right side and diagonal from belly button, lower right side. Sx started this am. Does the patient have any new or worsening symptoms? ---Yes Will a triage be completed? ---Yes Related visit to physician within the last 2 weeks? ---No Does the PT have any chronic conditions? (i.e. diabetes, asthma, this includes High risk factors for pregnancy, etc.) ---Yes List chronic conditions. ---TBI Is this a behavioral health or substance abuse call? ---No Guidelines Guideline Title Affirmed Question Affirmed Notes Nurse Date/Time Eilene Ghazi Time) Abdominal Pain - Female [1] MILDMODERATE pain AND [2] constant and [3]  present < 2 hours Deaton, RN, Levada Dy 02/02/2022 8:17:27 AM PLEASE NOTE: All timestamps contained within this report are represented as [place] Standard Time. CONFIDENTIALTY NOTICE: This fax transmission is intended only for the addressee. It contains information that is legally privileged, confidential or otherwise protected from use or disclosure. If you are not the intended recipient, you are strictly prohibited from reviewing, disclosing, copying using or disseminating any of this information or taking any action in reliance on or regarding this information. If you have received this fax in error, please notify us immediately by telephone so that we can arrange for its return to [place]. Phone: [phone], Toll-Free: [phone], Fax: [phone] Page: 2 of 2 Call Id: [id number] [place]. Time Eilene Ghazi Time) Disposition Final User 02/02/2022 8:13:43 AM Send to Urgent Queue Delman Cheadle 02/02/2022 8:21:24 AM See HCP within 4 Hours (or PCP triage) Yes Deaton, RN, Levada Dy Final Disposition 02/02/2022 8:21:24 AM See HCP within 4 Hours (or PCP triage) Yes Deaton, RN, Levada Dy Disposition Overriden: Home Care Override Reason: Patient's symptoms need a higher level of care Caller Disagree/Comply Comply Caller Understands Yes PreDisposition Did not know what to do Care Advice Given Per Guideline SEE HCP (OR PCP TRIAGE) WITHIN 4 HOURS: NOTHING BY MOUTH: * Do not eat or drink anything for now. CALL BACK IF: * You become worse CARE ADVICE given per Abdominal Pain - Female (Adult) guideline. * UCC: Some UCCs can manage patients who are stable and have less serious symptoms (e.g., minor illnesses and injuries). The triager must know the [facility] capabilities before sending a patient there. If unsure, call ahead. Comments User: Saverio Danker, RN Date/Time Eilene Ghazi Time): 02/02/2022 8:17:04 AM No pregnancy suspected. User: Saverio Danker, RN Date/Time Eilene Ghazi Time): 02/02/2022  8:25:07 AM Pain 3/10, 5/10 when  moving around. Pain started when she woke up. Caller advised that she is going to UC, did not want to wait to see if appt. Referrals GO TO FACILITY OTHER - SPECIF
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
## 2022-02-02 NOTE — Telephone Encounter (Signed)
Per chart review tab pt is at [facility] ED. Sending note to Dr Einar Pheasant as PCP.

## 2022-02-03 LAB — URINE CULTURE

## 2022-02-09 ENCOUNTER — Ambulatory Visit: Payer: Managed Care, Other (non HMO) | Admitting: Family Medicine

## 2022-02-10 ENCOUNTER — Ambulatory Visit (INDEPENDENT_AMBULATORY_CARE_PROVIDER_SITE_OTHER): Payer: Managed Care, Other (non HMO) | Admitting: Family Medicine

## 2022-02-10 VITALS — BP 110/70 | HR 82 | Temp 97.7°F | Ht 67.5 in | Wt 213.5 lb

## 2022-02-10 DIAGNOSIS — F411 Generalized anxiety disorder: Secondary | ICD-10-CM | POA: Diagnosis not present

## 2022-02-10 DIAGNOSIS — R1031 Right lower quadrant pain: Secondary | ICD-10-CM

## 2022-02-10 DIAGNOSIS — N83209 Unspecified ovarian cyst, unspecified side: Secondary | ICD-10-CM | POA: Diagnosis not present

## 2022-02-10 HISTORY — DX: Right lower quadrant pain: R10.31

## 2022-02-10 HISTORY — DX: Unspecified ovarian cyst, unspecified side: N83.209

## 2022-02-10 MED ORDER — ALPRAZOLAM 0.5 MG PO TABS: 0.5000 mg | ORAL_TABLET | Freq: Two times a day (BID) | ORAL | 0 refills | Status: DC | PRN

## 2022-02-10 NOTE — Assessment & Plan Note (Signed)
Reviewed ER visit including ultrasound and CT scan which showed evidence of possible ruptured ovarian cyst.  This was on the left ovary.  Also reviewed operative note from last year where patient had a right ovarian cyst removed.  Currently her symptoms are persisting though improving from last week.  She has follow-up with OB/GYN in approximately 3 weeks discussed recommendation for repeat ultrasound in 6 to 12 weeks which she will plan to get with her OB/GYN.  She is on birth control to try to help with ovarian cysts, discussed that she bring this up to her OB/GYN to see if adjustments should be made to her medication.

## 2022-02-10 NOTE — Assessment & Plan Note (Signed)
Likely secondary to ruptured cyst with some fluid in the abdomen, discussed that it may take some time for the fluid to completely resolve but overall reassuring that her pain is improving and is only with motion.  At this time her pain is controlled with ibuprofen and Tylenol recommended continuing both as needed.  Discussed return precautions including worsening pain.  Also advised that she update if she feels like she needs something stronger as she increases activity.

## 2022-02-10 NOTE — Patient Instructions (Signed)
Continue to do the Ibuprofen and Tylenol  As long as pain is improving that is a good sign  Update if worsening or needing stronger pain medication

## 2022-02-10 NOTE — Progress Notes (Signed)
Subjective:     Monica Forbes is a 26 y.o. female presenting for Abdominal Pain (Lower R with exertion, x 1 week. Denies N/V. No fever)     HPI  #Abdominal pain - over the weekend was having pain - 7/20 - ER for ruptured ovarian cyst - pain with putting laundry in the washer - Sunday pain just sitting in a pew at church - no new activity - going up and down the stairs - primarily having pain with certain activities  - has not been going to the gym and doing any training - pain that occurs will still be in the RLQ and deep pain - was doing tylenol/ibuprofen and around hour 3 will start getting pain  - yesterday was at rest so not taking medication as often  Worried that the pain is still present  Has ob/gyn f/u   Review of Systems   Social History   Tobacco Use  Smoking Status Never  Smokeless Tobacco Never        Objective:    BP Readings from Last 3 Encounters:  02/10/22 110/70  02/02/22 120/68  11/07/21 100/62   Wt Readings from Last 3 Encounters:  02/10/22 213 lb 8 oz (96.8 kg)  02/02/22 200 lb (90.7 kg)  11/07/21 199 lb 8 oz (90.5 kg)    BP 110/70   Pulse 82   Temp 97.7 F (36.5 C) (Temporal)   Ht 5' 7.5" (1.715 m)   Wt 213 lb 8 oz (96.8 kg)   LMP 01/15/2022 (Exact Date)   SpO2 99%   BMI 32.95 kg/m    Physical Exam Constitutional:      General: She is not in acute distress.    Appearance: She is well-developed. She is not diaphoretic.  HENT:     Right Ear: External ear normal.     Left Ear: External ear normal.     Nose: Nose normal.  Eyes:     Conjunctiva/sclera: Conjunctivae normal.  Cardiovascular:     Rate and Rhythm: Normal rate.  Pulmonary:     Effort: Pulmonary effort is normal.  Abdominal:     General: Abdomen is flat. Bowel sounds are normal. There is no distension.     Palpations: Abdomen is soft.     Tenderness: There is no abdominal tenderness. There is no guarding or rebound.  Musculoskeletal:     Cervical back: Neck  supple.  Skin:    General: Skin is warm and dry.     Capillary Refill: Capillary refill takes less than 2 seconds.  Neurological:     Mental Status: She is alert. Mental status is at baseline.  Psychiatric:        Mood and Affect: Mood normal.        Behavior: Behavior normal.           Assessment & Plan:   Problem List Items Addressed This Visit       Endocrine   Ruptured ovarian cyst    Reviewed ER visit including ultrasound and CT scan which showed evidence of possible ruptured ovarian cyst.  This was on the left ovary.  Also reviewed operative note from last year where patient had a right ovarian cyst removed.  Currently her symptoms are persisting though improving from last week.  She has follow-up with OB/GYN in approximately 3 weeks discussed recommendation for repeat ultrasound in 6 to 12 weeks which she will plan to get with her OB/GYN.  She is on birth control  to try to help with ovarian cysts, discussed that she bring this up to her OB/GYN to see if adjustments should be made to her medication.        Other   Generalized anxiety disorder    Anxiety has been worse in the setting of pain and uncertainty, reassurance provided regarding cyst and expected outcome.  Fill of Xanax provided due to increased use over the last week.  She will update if anxiety is not continuing to improve.  Continue Lexapro 10 mg daily      Relevant Medications   ALPRAZolam (XANAX) 0.5 MG tablet   RLQ abdominal pain - Primary    Likely secondary to ruptured cyst with some fluid in the abdomen, discussed that it may take some time for the fluid to completely resolve but overall reassuring that her pain is improving and is only with motion.  At this time her pain is controlled with ibuprofen and Tylenol recommended continuing both as needed.  Discussed return precautions including worsening pain.  Also advised that she update if she feels like she needs something stronger as she increases  activity.        Return if symptoms worsen or fail to improve.  Lesleigh Noe, MD

## 2022-02-10 NOTE — Assessment & Plan Note (Addendum)
Anxiety has been worse in the setting of pain and uncertainty, reassurance provided regarding cyst and expected outcome.  Fill of Xanax provided due to increased use over the last week.  She will update if anxiety is not continuing to improve.  Continue Lexapro 10 mg daily

## 2022-02-15 ENCOUNTER — Ambulatory Visit: Payer: Managed Care, Other (non HMO) | Admitting: Dermatology

## 2022-02-15 DIAGNOSIS — D225 Melanocytic nevi of trunk: Secondary | ICD-10-CM

## 2022-02-15 DIAGNOSIS — D229 Melanocytic nevi, unspecified: Secondary | ICD-10-CM

## 2022-02-15 DIAGNOSIS — L905 Scar conditions and fibrosis of skin: Secondary | ICD-10-CM

## 2022-02-15 DIAGNOSIS — Z1283 Encounter for screening for malignant neoplasm of skin: Secondary | ICD-10-CM | POA: Diagnosis not present

## 2022-02-15 DIAGNOSIS — Z808 Family history of malignant neoplasm of other organs or systems: Secondary | ICD-10-CM

## 2022-02-15 DIAGNOSIS — L820 Inflamed seborrheic keratosis: Secondary | ICD-10-CM | POA: Diagnosis not present

## 2022-02-15 DIAGNOSIS — L700 Acne vulgaris: Secondary | ICD-10-CM | POA: Diagnosis not present

## 2022-02-15 DIAGNOSIS — D224 Melanocytic nevi of scalp and neck: Secondary | ICD-10-CM

## 2022-02-15 DIAGNOSIS — I781 Nevus, non-neoplastic: Secondary | ICD-10-CM

## 2022-02-15 NOTE — Patient Instructions (Addendum)
Cryotherapy Aftercare  Wash gently with soap and water everyday.   Apply Vaseline and Band-Aid daily until healed.    Recommend Serica scar gel for scars on arm and back     Due to recent changes in healthcare laws, you may see results of your pathology and/or laboratory studies on MyChart before the doctors have had a chance to review them. We understand that in some cases there may be results that are confusing or concerning to you. Please understand that not all results are received at the same time and often the doctors may need to interpret multiple results in order to provide you with the best plan of care or course of treatment. Therefore, we ask that you please give [place] 2 business days to thoroughly review all your results before contacting the office for clarification. Should we see a critical lab result, you will be contacted sooner.   If You Need Anything After Your Visit  If you have any questions or concerns for your doctor, please call our main line at [phone] and press option 4 to reach your doctor's medical assistant. If no one answers, please leave a voicemail as directed and we will return your call as soon as possible. Messages left after 4 pm will be answered the following business day.   You may also send [place] a message via [place]. We typically respond to MyChart messages within 1-2 business days.  For prescription refills, please ask your pharmacy to contact our office. Our fax number is [phone].  If you have an urgent issue when the clinic is closed that cannot wait until the next business day, you can page your doctor at the number below.    Please note that while we do our best to be available for urgent issues outside of office hours, we are not available 24/7.   If you have an urgent issue and are unable to reach [place], you may choose to seek medical care at your doctor's office, retail clinic, urgent care center, or emergency room.  If you have a medical  emergency, please immediately call 911 or go to the emergency department.  Pager Numbers  - Dr. Nehemiah Massed: [phone]  - Dr. Laurence Ferrari: [phone]  - Dr. Nicole Kindred: [phone]  In the event of inclement weather, please call our main line at [phone] for an update on the status of any delays or closures.  Dermatology Medication Tips: Please keep the boxes that topical medications come in in order to help keep track of the instructions about where and how to use these. Pharmacies typically print the medication instructions only on the boxes and not directly on the medication tubes.   If your medication is too expensive, please contact our office at [phone] option 4 or send [place] a message through [place].   We are unable to tell what your co-pay for medications will be in advance as this is different depending on your insurance coverage. However, we may be able to find a substitute medication at lower cost or fill out paperwork to get insurance to cover a needed medication.   If a prior authorization is required to get your medication covered by your insurance company, please allow [place] 1-2 business days to complete this process.  Drug prices often vary depending on where the prescription is filled and some pharmacies may offer cheaper prices.  The website www.goodrx.com contains coupons for medications through different pharmacies. The prices here do not account for what the cost may be with help  from insurance (it may be cheaper with your insurance), but the website can give you the price if you did not use any insurance.  - You can print the associated coupon and take it with your prescription to the pharmacy.  - You may also stop by our office during regular business hours and pick up a GoodRx coupon card.  - If you need your prescription sent electronically to a different pharmacy, notify our office through [facility] or by phone at [phone] option 4.     Si Usted  Necesita Algo Despus de Su Visita  Tambin puede enviarnos un mensaje a travs de Pharmacist, community. Por lo general respondemos a los mensajes de MyChart en el transcurso de 1 a 2 das hbiles.  Para renovar recetas, por favor pida a su farmacia que se ponga en contacto con nuestra oficina. Harland Dingwall de fax es [place] [phone].  Si tiene un asunto urgente cuando la clnica est cerrada y que no puede esperar hasta el siguiente da hbil, puede llamar/localizar a su doctor(a) al nmero que aparece a continuacin.   Por favor, tenga en cuenta que aunque hacemos todo lo posible para estar disponibles para asuntos urgentes fuera del horario de [place], no estamos disponibles las 24 horas del da, los 7 das de la [place].   Si tiene un problema urgente y no puede comunicarse con nosotros, puede optar por buscar atencin mdica  en el consultorio de su doctor(a), en una clnica privada, en un centro de atencin urgente o en una sala de emergencias.  Si tiene Engineering geologist, por favor llame inmediatamente al 911 o vaya a la sala de emergencias.  Nmeros de bper  - Dr. Nehemiah Massed: [phone]  - Dra. Moye: [phone]  - Dra. Nicole Kindred: [phone]  En caso de inclemencias del [place], por favor llame a Johnsie Kindred principal al [phone] para una actualizacin sobre el [place] de cualquier retraso o cierre.  Consejos para la medicacin en dermatologa: Por favor, guarde las cajas en las que vienen los medicamentos de uso tpico para ayudarle a seguir las instrucciones sobre dnde y cmo usarlos. Las farmacias generalmente imprimen las instrucciones del medicamento slo en las cajas y no directamente en los tubos del [place].   Si su medicamento es muy caro, por favor, pngase en contacto con Zigmund Daniel llamando al [phone] y presione la opcin 4 o envenos un mensaje a travs de Pharmacist, community.   No podemos decirle cul ser su copago por los medicamentos por adelantado ya que esto es  diferente dependiendo de la cobertura de su seguro. Sin embargo, es posible que podamos encontrar un medicamento sustituto a Electrical engineer un formulario para que el seguro cubra el medicamento que se considera necesario.   Si se requiere una autorizacin previa para que su compaa de seguros [place] su medicamento, por favor permtanos de 1 a 2 das hbiles para completar este proceso.  Los precios de los medicamentos varan con frecuencia dependiendo del Environmental consultant de dnde se surte la receta y alguna farmacias pueden ofrecer precios ms baratos.  El sitio web www.goodrx.com tiene cupones para medicamentos de Airline pilot. Los precios aqu no tienen en cuenta lo que podra costar con la ayuda del seguro (puede ser ms barato con su seguro), pero el sitio web puede darle el precio si no utiliz Research scientist (physical sciences).  - Puede imprimir el cupn correspondiente y llevarlo con su receta a la farmacia.  - Tambin puede pasar por nuestra oficina durante el horario  de atencin regular y Charity fundraiser una tarjeta de cupones de GoodRx.  - Si necesita que su receta se enve electrnicamente a una farmacia diferente, informe a nuestra oficina a travs de MyChart de  o por telfono llamando al [phone] y presione la opcin 4.

## 2022-02-15 NOTE — Progress Notes (Unsigned)
New Patient Visit  Subjective  Monica Forbes is a 26 y.o. female who presents for the following: skin tags (neck), Acne (Back,>44yr, topical Panoxyl wash), scars (L arm, ~459yr otc scar treatments), and Nevus (Scalp, R clavicle, has had for yrs, fhx of melanoma aunt and grandmother). The patient has spots, moles and lesions to be evaluated, some may be new or changing and the patient has concerns that these could be cancer.  Patient declines total-body skin exam today.  New patient referral from Dr. JeWaunita Schooner The following portions of the chart were reviewed this encounter and updated as appropriate:   Tobacco  Allergies  Meds  Problems  Med Hx  Surg Hx  Fam Hx     Review of Systems:  No other skin or systemic complaints except as noted in HPI or Assessment and Plan.  Objective  Well appearing patient in no apparent distress; mood and affect are within normal limits.  A focused examination was performed including face, neck, back, arms, chest. Relevant physical exam findings are noted in the Assessment and Plan.  neck x 16 (16) Stuck on waxy paps with erythema  L arm Scars L arm, back     back Paps and inflamed comedones almost confluent over the back     R clavicle; L frontal hairline scalp  R clavicle 0.6cm brown regular pap  L frontal hairline scalp 0.4cm brown macule       Head - Anterior (Face), Left Eye Dilated vessel    Assessment & Plan  Inflamed seborrheic keratosis (16) neck x 16 Symptomatic, irritating, patient would like treated. Destruction of lesion - neck x 16 Complexity: simple   Destruction method: cryotherapy   Informed consent: discussed and consent obtained   Timeout:  patient name, date of birth, surgical site, and procedure verified Lesion destroyed using liquid nitrogen: Yes   Region frozen until ice ball extended beyond lesion: Yes   Outcome: patient tolerated procedure well with no complications   Post-procedure  details: wound care instructions given    Scar L arm 2ndary to surgery in 2019 Discussed Laser treatments, do not recommend at this time,  Recommend Serica scar gel, information given  Acne vulgaris back Chronic and persistent condition with duration or expected duration over one year. Condition is symptomatic / bothersome to patient. Not to goal.  Recommend Fabior foam pt declines at this time may be considering getting pregnant  Cont Panoxyl wash qd to wash back  May consider Fabior Foam, Doxycyline in the future, discussed Isotretinoin, pt will let usKoreanow if she wants to start prescription medication  Nevus (2) R clavicle; L frontal hairline scalp See photos Benign-appearing.  Observation.  Call clinic for new or changing lesions.  Recommend daily use of broad spectrum spf 30+ sunscreen to sun-exposed areas.   Telangiectasia (2) Head - Anterior (Face); Left Eye Benign, observe.   Discussed the treatment option of BBL/laser.  Typically we recommend 1-3 treatment sessions about 5-8 weeks apart for best results.  The patient's condition may require "maintenance treatments" in the future.  The fee for BBL / laser treatments is $200 per treatment spot treatment.  A fee can be quoted for other parts of the body. Insurance typically does not pay for BBL/laser treatments and therefore the fee is an out-of-pocket cost.  Family history of skin cancer - Melanoma, Aunt and grandmother   Return if symptoms worsen or fail to improve.  I, Sonya Hupman, RMA, am acting as scribe  for Sarina Ser, MD . Documentation: I have reviewed the above documentation for accuracy and completeness, and I agree with the above.  Sarina Ser, MD

## 2022-02-16 ENCOUNTER — Encounter: Payer: Self-pay | Admitting: Dermatology

## 2022-02-17 ENCOUNTER — Encounter: Payer: Self-pay | Admitting: Family Medicine

## 2022-02-17 DIAGNOSIS — F410 Panic disorder [episodic paroxysmal anxiety] without agoraphobia: Secondary | ICD-10-CM

## 2022-02-17 DIAGNOSIS — Z3041 Encounter for surveillance of contraceptive pills: Secondary | ICD-10-CM

## 2022-02-17 MED ORDER — ESCITALOPRAM OXALATE 20 MG PO TABS: 20.0000 mg | ORAL_TABLET | Freq: Every day | ORAL | 0 refills | Status: DC

## 2022-02-17 MED ORDER — LEVONORGESTREL-ETHINYL ESTRAD 0.1-20 MG-MCG PO TABS: 1.0000 | ORAL_TABLET | Freq: Every day | ORAL | 3 refills | Status: DC

## 2022-02-20 ENCOUNTER — Ambulatory Visit (INDEPENDENT_AMBULATORY_CARE_PROVIDER_SITE_OTHER): Payer: Managed Care, Other (non HMO) | Admitting: Obstetrics & Gynecology

## 2022-02-20 ENCOUNTER — Encounter: Payer: Self-pay | Admitting: Obstetrics & Gynecology

## 2022-02-20 VITALS — BP 100/60 | Ht 68.0 in | Wt 209.0 lb

## 2022-02-20 DIAGNOSIS — N83209 Unspecified ovarian cyst, unspecified side: Secondary | ICD-10-CM | POA: Diagnosis not present

## 2022-02-20 DIAGNOSIS — R102 Pelvic and perineal pain: Secondary | ICD-10-CM

## 2022-02-20 MED ORDER — NORGESTREL-ETHINYL ESTRADIOL 0.3-30 MG-MCG PO TABS: 1.0000 | ORAL_TABLET | Freq: Every day | ORAL | 11 refills | Status: AC
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
## 2022-02-28 ENCOUNTER — Ambulatory Visit: Payer: Managed Care, Other (non HMO) | Admitting: Obstetrics & Gynecology

## 2022-03-15 ENCOUNTER — Encounter: Payer: Self-pay | Admitting: Family Medicine

## 2022-04-24 ENCOUNTER — Ambulatory Visit
Admission: RE | Admit: 2022-04-24 | Discharge: 2022-04-24 | Disposition: A | Discharge status: Home / Self Care | Payer: Managed Care, Other (non HMO) | Source: Ambulatory Visit | Attending: Obstetrics & Gynecology | Admitting: Obstetrics & Gynecology

## 2022-04-24 DIAGNOSIS — N83209 Unspecified ovarian cyst, unspecified side: Secondary | ICD-10-CM | POA: Insufficient documentation

## 2022-04-26 ENCOUNTER — Other Ambulatory Visit: Payer: Self-pay | Admitting: Family Medicine

## 2022-04-26 ENCOUNTER — Telehealth: Payer: Self-pay | Admitting: Family Medicine

## 2022-04-26 DIAGNOSIS — F411 Generalized anxiety disorder: Secondary | ICD-10-CM

## 2022-04-26 MED ORDER — ALPRAZOLAM 0.5 MG PO TABS: 0.5000 mg | ORAL_TABLET | Freq: Two times a day (BID) | ORAL | 0 refills | Status: DC | PRN

## 2022-04-26 NOTE — Telephone Encounter (Signed)
Refill request sent to [facility] in a separate phone message.

## 2022-04-26 NOTE — Telephone Encounter (Signed)
  Encourage patient to contact the pharmacy for refills or they can request refills through [facility]  Did the patient contact the pharmacy: no    LAST APPOINTMENT DATE:02/10/22  NEXT APPOINTMENT DATE:  MEDICATION:ALPRAZolam (XANAX) 0.5 MG tablet  Is the patient out of medication? YES  If not, how much is left?  Is this a 90 day supply: 20 tablets  [id number] [place]  [phone] Fax:  [phone]     Let patient know to contact pharmacy at the end of the day to make sure medication is ready.  Please notify patient to allow 48-72 hours to process

## 2022-04-26 NOTE — Telephone Encounter (Signed)
Patient stopped by the office to get an refill on rxALPRAZolam (XANAX) 0.5 MG tablet . She also stated that she will be taking a flight on Friday,and would like to know if there is any way the dosage can be increased or can another rx be called in to help her with her nervousness?

## 2022-04-26 NOTE — Telephone Encounter (Signed)
Pt called & asked for a update on med refill

## 2022-04-26 NOTE — Telephone Encounter (Signed)
Last refill: 02/10/22 #20 w/0 Last OV: 02/10/22  Please see phone message as well.

## 2022-04-27 NOTE — Telephone Encounter (Signed)
Called patient let know that refill was sent in. Offered to make [facility] appointment. Patient has decided to set up care with provider with [facility]. She does not have appointment until January. Will she need to be seen by someone in our office before in order to continue to get refills?

## 2022-04-28 NOTE — Telephone Encounter (Signed)
I would use the current rx in the meantime. I she has OV pending with GYN for 10/27.  I suggest she discuss with them since they are in the [facility] network.

## 2022-05-01 NOTE — Telephone Encounter (Signed)
Pt read mychart at 10:38 AM on 05/01/2022.

## 2022-05-01 NOTE — Telephone Encounter (Signed)
Mychart sent to pt.

## 2022-05-22 ENCOUNTER — Ambulatory Visit: Payer: Managed Care, Other (non HMO) | Admitting: Family Medicine

## 2022-05-22 ENCOUNTER — Telehealth (INDEPENDENT_AMBULATORY_CARE_PROVIDER_SITE_OTHER): Payer: Managed Care, Other (non HMO) | Admitting: Family Medicine

## 2022-05-22 ENCOUNTER — Encounter: Payer: Self-pay | Admitting: Family Medicine

## 2022-05-22 DIAGNOSIS — F411 Generalized anxiety disorder: Secondary | ICD-10-CM

## 2022-05-22 MED ORDER — ALPRAZOLAM 0.5 MG PO TABS: 0.5000 mg | ORAL_TABLET | Freq: Two times a day (BID) | ORAL | 0 refills | Status: DC | PRN
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
## 2022-05-22 NOTE — Patient Instructions (Signed)
Keep xanax on hand for emergencies but do not use unless needed  Continue to hold the lexapro, glad you are improving  Hopefully sleep will improve with time  Continue the nortriptyline  Please keep [place] posted

## 2022-05-22 NOTE — Assessment & Plan Note (Signed)
Struggled with withdrawal anxiety and brain zaps getting off lexapro but better now  Needs refill of xanax which she plans not to use except for emergencies  Will continue nortiptyline -helping some in context of traumatic brain imaging  Reviewed stressors/ coping techniques/symptoms/ support sources/ tx options and side effects in detail today   Plan to continue counseling Continue journal/ feeling wheel Continue neuro care Enc good self care

## 2022-08-07 ENCOUNTER — Other Ambulatory Visit: Payer: Self-pay | Admitting: Internal Medicine

## 2022-08-07 DIAGNOSIS — F0670 Mild neurocognitive disorder due to known physiological condition without behavioral disturbance: Secondary | ICD-10-CM

## 2022-08-07 DIAGNOSIS — R519 Headache, unspecified: Secondary | ICD-10-CM

## 2022-08-18 ENCOUNTER — Ambulatory Visit: Payer: Managed Care, Other (non HMO) | Admitting: Dietician

## 2022-08-31 ENCOUNTER — Ambulatory Visit
Admission: RE | Admit: 2022-08-31 | Discharge: 2022-08-31 | Disposition: A | Discharge status: Home / Self Care | Payer: Managed Care, Other (non HMO) | Source: Ambulatory Visit | Attending: Internal Medicine | Admitting: Internal Medicine

## 2022-08-31 DIAGNOSIS — R519 Headache, unspecified: Secondary | ICD-10-CM

## 2022-08-31 DIAGNOSIS — S069XAS Unspecified intracranial injury with loss of consciousness status unknown, sequela: Secondary | ICD-10-CM

## 2022-08-31 DIAGNOSIS — F0670 Mild neurocognitive disorder due to known physiological condition without behavioral disturbance: Secondary | ICD-10-CM

## 2022-08-31 MED ORDER — GADOPICLENOL 0.5 MMOL/ML IV SOLN
9.0000 mL | Freq: Once | INTRAVENOUS | Status: AC | PRN
  Administered 2022-08-31: 9 mL via INTRAVENOUS

## 2022-11-24 ENCOUNTER — Ambulatory Visit
Admission: EM | Admit: 2022-11-24 | Discharge: 2022-11-24 | Disposition: A | Discharge status: Home / Self Care | Payer: Managed Care, Other (non HMO) | Attending: Urgent Care | Admitting: Urgent Care

## 2022-11-24 DIAGNOSIS — J069 Acute upper respiratory infection, unspecified: Secondary | ICD-10-CM | POA: Diagnosis not present

## 2022-11-24 LAB — POCT RAPID STREP A (OFFICE): Rapid Strep A Screen: NEGATIVE
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
## 2022-11-24 NOTE — ED Triage Notes (Signed)
Patient to Urgent Care with complaints of  nasal congestion and sore throat.  Reports symptoms started four days ago. Denies any known fevers- has felt feverish.  Has been taking otc cold and flu meds/ cough drops.

## 2022-11-24 NOTE — Discharge Instructions (Signed)
Follow up here or with your primary care provider if your symptoms are worsening or not improving.     

## 2022-11-25 ENCOUNTER — Other Ambulatory Visit: Payer: Self-pay | Admitting: Obstetrics & Gynecology

## 2023-01-02 ENCOUNTER — Ambulatory Visit: Payer: Managed Care, Other (non HMO) | Admitting: Dermatology

## 2023-01-02 VITALS — BP 110/82 | HR 78

## 2023-01-02 DIAGNOSIS — Z808 Family history of malignant neoplasm of other organs or systems: Secondary | ICD-10-CM | POA: Diagnosis not present

## 2023-01-02 DIAGNOSIS — L910 Hypertrophic scar: Secondary | ICD-10-CM | POA: Diagnosis not present

## 2023-01-02 DIAGNOSIS — I781 Nevus, non-neoplastic: Secondary | ICD-10-CM

## 2023-01-02 NOTE — Progress Notes (Signed)
   Follow-Up Visit   Subjective  Monica Forbes is a 27 y.o. female who presents for the following: patient is concerned with spot at nose and reports some itchy scars from spinal fusion at back.   The following portions of the chart were reviewed this encounter and updated as appropriate: medications, allergies, medical history  Review of Systems:  No other skin or systemic complaints except as noted in HPI or Assessment and Plan.  Objective  Well appearing patient in no apparent distress; mood and affect are within normal limits.    A focused examination was performed of the following areas: Nose, back, face   Relevant exam findings are noted in the Assessment and Plan.    Assessment & Plan   TELANGIECTASIA  Exam: blanching pink papule.- 1.5 mm upper nasal dorsum  Treatment Plan: Benign appearing on exam. Observe. Call for changes.   Counseling for BBL / IPL / Laser and Coordination of Care Discussed the treatment option of Broad Band Light (BBL) /Intense Pulsed Light (IPL)/ Laser for skin discoloration, including brown spots and redness.  Typically we recommend at least 1-3 treatment sessions about 5-8 weeks apart for best results.  Cannot have tanned skin when BBL performed, and regular use of sunscreen is advised after the procedure to help maintain results. The patient's condition may also require "maintenance treatments" in the future.  The fee for BBL / laser treatments is $350 per treatment session for the whole face.  A fee can be quoted for other parts of the body.  Insurance typically does not pay for BBL/laser treatments and therefore the fee is an out-of-pocket cost.  One spot BBL cost is $200 Discussed ED at $60 out of pocket cost for cosmetic treatment.    FAMILY HISTORY OF SKIN CANCER What type(s):melanoma Who affected:paternal grandmother and paternal aunt  HYPERTROPHIC SCAR - spinal low back, from spinal fusion surgery  Exam: hypopigmented firm  papules/plaque  Benign-appearing.  Call clinic for new or changing lesions. Discussed option of topical steroid or intralesional triamcinolone if this becomes bothersome.   Treatment Plan: Recommend Serica moisturizing scar formula cream every night or [facility] brand or Mederma silicone scar sheet every night for the first year after a scar appears to help with scar remodeling if desired. Scars remodel on their own for a full year and will gradually improve in appearance over time.  Return if symptoms worsen or fail to improve.  Maylene Roes, CMA, am acting as scribe for Willeen Niece, MD .  Documentation: I have reviewed the above documentation for accuracy and completeness, and I agree with the above.  Willeen Niece, MD

## 2023-01-02 NOTE — Patient Instructions (Addendum)
Recommend Serica moisturizing scar formula cream every night or [facility] brand or Mederma silicone scar sheet every night for the first year after a scar appears to help with scar remodeling if desired. Scars remodel on their own for a full year and will gradually improve in appearance over time.  Due to recent changes in healthcare laws, you may see results of your pathology and/or laboratory studies on MyChart before the doctors have had a chance to review them. We understand that in some cases there may be results that are confusing or concerning to you. Please understand that not all results are received at the same time and often the doctors may need to interpret multiple results in order to provide you with the best plan of care or course of treatment. Therefore, we ask that you please give us 2 business days to thoroughly review all your results before contacting the office for clarification. Should we see a critical lab result, you will be contacted sooner.   If You Need Anything After Your Visit  If you have any questions or concerns for your doctor, please call our main line at [phone] and press option 4 to reach your doctor's medical assistant. If no one answers, please leave a voicemail as directed and we will return your call as soon as possible. Messages left after 4 pm will be answered the following business day.   You may also send us a message via MyChart. We typically respond to MyChart messages within 1-2 business days.  For prescription refills, please ask your pharmacy to contact our office. Our fax number is [phone].  If you have an urgent issue when the clinic is closed that cannot wait until the next business day, you can page your doctor at the number below.    Please note that while we do our best to be available for urgent issues outside of office hours, we are not available 24/7.   If you have an urgent issue and are unable to reach us, you may choose to seek  medical care at your doctor's office, retail clinic, urgent care center, or emergency room.  If you have a medical emergency, please immediately call 911 or go to the emergency department.  Pager Numbers  - Dr. Kowalski: [phone]  - Dr. Moye: [phone]  - Dr. Stewart: [phone]  In the event of inclement weather, please call our main line at [phone] for an update on the status of any delays or closures.  Dermatology Medication Tips: Please keep the boxes that topical medications come in in order to help keep track of the instructions about where and how to use these. Pharmacies typically print the medication instructions only on the boxes and not directly on the medication tubes.   If your medication is too expensive, please contact our office at [phone] option 4 or send us a message through MyChart.   We are unable to tell what your co-pay for medications will be in advance as this is different depending on your insurance coverage. However, we may be able to find a substitute medication at lower cost or fill out paperwork to get insurance to cover a needed medication.   If a prior authorization is required to get your medication covered by your insurance company, please allow us 1-2 business days to complete this process.  Drug prices often vary depending on where the prescription is filled and some pharmacies may offer cheaper prices.  The website www.goodrx.com contains coupons for medications through different   pharmacies. The prices here do not account for what the cost may be with help from insurance (it may be cheaper with your insurance), but the website can give you the price if you did not use any insurance.  - You can print the associated coupon and take it with your prescription to the pharmacy.  - You may also stop by our office during regular business hours and pick up a GoodRx coupon card.  - If you need your prescription sent electronically to a  different pharmacy, notify our office through [facility] MyChart or by phone at [phone] option 4.     Si Usted Necesita Algo Despus de Su Visita  Tambin puede enviarnos un mensaje a travs de MyChart. Por lo general respondemos a los mensajes de MyChart en el transcurso de 1 a 2 das hbiles.  Para renovar recetas, por favor pida a su farmacia que se ponga en contacto con nuestra oficina. Nuestro nmero de fax es el [phone].  Si tiene un asunto urgente cuando la clnica est cerrada y que no puede esperar hasta el siguiente da hbil, puede llamar/localizar a su doctor(a) al nmero que aparece a continuacin.   Por favor, tenga en cuenta que aunque hacemos todo lo posible para estar disponibles para asuntos urgentes fuera del horario de oficina, no estamos disponibles las 24 horas del da, los 7 das de la semana.   Si tiene un problema urgente y no puede comunicarse con nosotros, puede optar por buscar atencin mdica  en el consultorio de su doctor(a), en una clnica privada, en un centro de atencin urgente o en una sala de emergencias.  Si tiene una emergencia mdica, por favor llame inmediatamente al 911 o vaya a la sala de emergencias.  Nmeros de bper  - Dr. Kowalski: [phone]  - Dra. Moye: [phone]  - Dra. Stewart: [phone]  En caso de inclemencias del tiempo, por favor llame a nuestra lnea principal al [phone] para una actualizacin sobre el estado de cualquier retraso o cierre.  Consejos para la medicacin en dermatologa: Por favor, guarde las cajas en las que vienen los medicamentos de uso tpico para ayudarle a seguir las instrucciones sobre dnde y cmo usarlos. Las farmacias generalmente imprimen las instrucciones del medicamento slo en las cajas y no directamente en los tubos del medicamento.   Si su medicamento es muy caro, por favor, pngase en contacto con nuestra oficina llamando al [phone] y presione la opcin 4 o envenos un  mensaje a travs de MyChart.   No podemos decirle cul ser su copago por los medicamentos por adelantado ya que esto es diferente dependiendo de la cobertura de su seguro. Sin embargo, es posible que podamos encontrar un medicamento sustituto a menor costo o llenar un formulario para que el seguro cubra el medicamento que se considera necesario.   Si se requiere una autorizacin previa para que su compaa de seguros cubra su medicamento, por favor permtanos de 1 a 2 das hbiles para completar este proceso.  Los precios de los medicamentos varan con frecuencia dependiendo del lugar de dnde se surte la receta y alguna farmacias pueden ofrecer precios ms baratos.  El sitio web www.goodrx.com tiene cupones para medicamentos de diferentes farmacias. Los precios aqu no tienen en cuenta lo que podra costar con la ayuda del seguro (puede ser ms barato con su seguro), pero el sitio web puede darle el precio si no utiliz ningn seguro.  - Puede imprimir el cupn correspondiente y llevarlo con su   receta a la farmacia.  - Tambin puede pasar por nuestra oficina durante el horario de atencin regular y recoger una tarjeta de cupones de GoodRx.  - Si necesita que su receta se enve electrnicamente a una farmacia diferente, informe a nuestra oficina a travs de MyChart de [place] o por telfono llamando al [phone] y presione la opcin 4.  

## 2023-02-12 ENCOUNTER — Ambulatory Visit (INDEPENDENT_AMBULATORY_CARE_PROVIDER_SITE_OTHER): Payer: Self-pay | Admitting: Dermatology

## 2023-02-12 ENCOUNTER — Encounter: Payer: Self-pay | Admitting: Dermatology

## 2023-02-12 VITALS — BP 126/85 | HR 95

## 2023-02-12 DIAGNOSIS — I781 Nevus, non-neoplastic: Secondary | ICD-10-CM

## 2023-02-12 NOTE — Patient Instructions (Addendum)

## 2023-02-12 NOTE — Progress Notes (Signed)
   Follow-Up Visit   Subjective  Monica Forbes is a 27 y.o. female who presents for the following:  patient would like spot at nose   The following portions of the chart were reviewed this encounter and updated as appropriate: medications, allergies, medical history  Review of Systems:  No other skin or systemic complaints except as noted in HPI or Assessment and Plan.  Objective  Well appearing patient in no apparent distress; mood and affect are within normal limits. A focused examination was performed of the following areas: nose Relevant exam findings are noted in the Assessment and Plan.  Nose  dilated blood vessel        Assessment & Plan   Telangiectasia Nose - dorsum nasal bridge See photo Benign appearing   Patient would like treated -  bothers patient   Discussed ED vs BBL   Patient prefers ED today   Discussed potential for persistence or recurrence.  May need more than one treatment.  There is a fee for each treatment. Discussed scarring risk - scarring risk may be lower with Laser BBL treatment than ED.  Pt understands and wants to do ED today.  Patient consent signed.  Destruction of lesion - Nose Complexity: simple   Destruction method comment:  Electrodesiccation after anesthesia Informed consent: discussed and consent obtained   Timeout:  patient name, date of birth, surgical site, and procedure verified Procedure prep:  Patient was prepped and draped in usual sterile fashion Prep type:  Isopropyl alcohol Anesthesia: the lesion was anesthetized in a standard fashion   Anesthetic:  1% lidocaine w/ epinephrine 1-100,000 buffered w/ 8.4% NaHCO3 Hemostasis achieved with:  electrodesiccation Outcome: patient tolerated procedure well with no complications   Post-procedure details: sterile dressing applied and wound care instructions given   Dressing type: bandage and petrolatum    Discussed potential for persistence or recurrence.  May need more  than one treatment.  There is a fee for each treatment. Discussed scarring risk - scarring risk may be lower with Laser BBL treatment than ED.  Pt understands and wants to do ED today.  No follow-ups on file.  IAsher Muir, CMA, am acting as scribe for Armida Sans, MD.   Documentation: I have reviewed the above documentation for accuracy and completeness, and I agree with the above.  Armida Sans, MD

## 2023-04-03 ENCOUNTER — Ambulatory Visit
Admission: EM | Admit: 2023-04-03 | Discharge: 2023-04-03 | Disposition: A | Discharge status: Home / Self Care | Payer: Managed Care, Other (non HMO)

## 2023-04-03 DIAGNOSIS — R0981 Nasal congestion: Secondary | ICD-10-CM

## 2023-04-03 DIAGNOSIS — H6993 Unspecified Eustachian tube disorder, bilateral: Secondary | ICD-10-CM

## 2023-04-03 MED ORDER — FLUTICASONE PROPIONATE 50 MCG/ACT NA SUSP: 1.0000 | Freq: Every day | NASAL | 0 refills | Status: AC

## 2023-04-03 MED ORDER — GUAIFENESIN ER 600 MG PO TB12: 1200.0000 mg | ORAL_TABLET | Freq: Two times a day (BID) | ORAL | 0 refills | Status: AC | PRN

## 2023-04-03 NOTE — Discharge Instructions (Addendum)
Use the Flonase nasal spray and take the Mucinex as directed.  Follow up with your primary care provider if your symptoms are not improving.
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
## 2023-04-03 NOTE — ED Triage Notes (Signed)
Patient to Urgent Care with complaints of bilateral ear pain. No drainage. Worse when laying down. Denies any fevers. Has had some nasal congestion.   Symptoms started two days ago.

## 2023-06-07 ENCOUNTER — Encounter: Payer: Self-pay | Admitting: Dermatology

## 2023-06-07 ENCOUNTER — Ambulatory Visit: Payer: Managed Care, Other (non HMO) | Admitting: Dermatology

## 2023-06-07 DIAGNOSIS — Z872 Personal history of diseases of the skin and subcutaneous tissue: Secondary | ICD-10-CM

## 2023-06-07 DIAGNOSIS — L700 Acne vulgaris: Secondary | ICD-10-CM

## 2023-06-07 DIAGNOSIS — Z79899 Other long term (current) drug therapy: Secondary | ICD-10-CM

## 2023-06-07 DIAGNOSIS — Z7189 Other specified counseling: Secondary | ICD-10-CM

## 2023-06-07 DIAGNOSIS — D229 Melanocytic nevi, unspecified: Secondary | ICD-10-CM

## 2023-06-07 DIAGNOSIS — L738 Other specified follicular disorders: Secondary | ICD-10-CM

## 2023-06-07 MED ORDER — WINLEVI 1 % EX CREA: TOPICAL_CREAM | CUTANEOUS | 2 refills | Status: DC

## 2023-06-07 MED ORDER — AMZEEQ 4 % EX FOAM: CUTANEOUS | 2 refills | Status: DC

## 2023-06-07 NOTE — Patient Instructions (Signed)
Topical retinoid medications like tretinoin/Retin-A, adapalene/Differin, tazarotene/Fabior, and Epiduo/Epiduo Forte can cause dryness and irritation when first started. Only apply a pea-sized amount to the entire affected area. Avoid applying it around the eyes, edges of mouth and creases at the nose. If you experience irritation, use a good moisturizer first and/or apply the medicine less often. If you are doing well with the medicine, you can increase how often you use it until you are applying every night. Be careful with sun protection while using this medication as it can make you sensitive to the sun. This medicine should not be used by pregnant women.   Can use "sandwich method" with moisturizer - apply moisturizer, then medication, then moisturizer.  Due to recent changes in healthcare laws, you may see results of your pathology and/or laboratory studies on MyChart before the doctors have had a chance to review them. We understand that in some cases there may be results that are confusing or concerning to you. Please understand that not all results are received at the same time and often the doctors may need to interpret multiple results in order to provide you with the best plan of care or course of treatment. Therefore, we ask that you please give [place] 2 business days to thoroughly review all your results before contacting the office for clarification. Should we see a critical lab result, you will be contacted sooner.   If You Need Anything After Your Visit  If you have any questions or concerns for your doctor, please call our main line at [phone] and press option 4 to reach your doctor's medical assistant. If no one answers, please leave a voicemail as directed and we will return your call as soon as possible. Messages left after 4 pm will be answered the following business day.   You may also send [place] a message via MyChart. We typically respond to MyChart messages within 1-2 business  days.  For prescription refills, please ask your pharmacy to contact our office. Our fax number is [phone].  If you have an urgent issue when the clinic is closed that cannot wait until the next business day, you can page your doctor at the number below.    Please note that while we do our best to be available for urgent issues outside of office hours, we are not available 24/7.   If you have an urgent issue and are unable to reach [place], you may choose to seek medical care at your doctor's office, retail clinic, urgent care center, or emergency room.  If you have a medical emergency, please immediately call 911 or go to the emergency department.  Pager Numbers  - Dr. Gwen Pounds: [phone]  - Dr. Roseanne Reno: [phone]  - Dr. Katrinka Blazing: [phone]   In the event of inclement weather, please call our main line at [phone] for an update on the status of any delays or closures.  Dermatology Medication Tips: Please keep the boxes that topical medications come in in order to help keep track of the instructions about where and how to use these. Pharmacies typically print the medication instructions only on the boxes and not directly on the medication tubes.   If your medication is too expensive, please contact our office at [phone] option 4 or send [place] a message through MyChart.   We are unable to tell what your co-pay for medications will be in advance as this is different depending on your insurance coverage. However, we may be able to find a substitute  medication at lower cost or fill out paperwork to get insurance to cover a needed medication.   If a prior authorization is required to get your medication covered by your insurance company, please allow [place] 1-2 business days to complete this process.  Drug prices often vary depending on where the prescription is filled and some pharmacies may offer cheaper prices.  The website www.goodrx.com contains coupons for medications  through different pharmacies. The prices here do not account for what the cost may be with help from insurance (it may be cheaper with your insurance), but the website can give you the price if you did not use any insurance.  - You can print the associated coupon and take it with your prescription to the pharmacy.  - You may also stop by our office during regular business hours and pick up a GoodRx coupon card.  - If you need your prescription sent electronically to a different pharmacy, notify our office through [facility] or by phone at [phone] option 4.     Si Usted Necesita Algo Despus de Su Visita  Tambin puede enviarnos un mensaje a travs de Clinical cytogeneticist. Por lo general respondemos a los mensajes de MyChart en el transcurso de 1 a 2 das hbiles.  Para renovar recetas, por favor pida a su farmacia que se ponga en contacto con nuestra oficina. Annie Sable de fax es [place] [phone].  Si tiene un asunto urgente cuando la clnica est cerrada y que no puede esperar hasta el siguiente da hbil, puede llamar/localizar a su doctor(a) al nmero que aparece a continuacin.   Por favor, tenga en cuenta que aunque hacemos todo lo posible para estar disponibles para asuntos urgentes fuera del horario de [place], no estamos disponibles las 24 horas del da, los 7 [address]  [address] de la [place].   Si tiene un problema urgente y no puede comunicarse con nosotros, puede optar por buscar atencin mdica  en el consultorio de su doctor(a), en una clnica privada, en un centro de atencin urgente o en una sala de emergencias.  Si tiene Engineer, drilling, por favor llame inmediatamente al 911 o vaya a la sala de emergencias.  Nmeros de bper  - Dr. Gwen Pounds: [phone]  - Dra. Roseanne Reno: [phone]  - Dr. Katrinka Blazing: [phone]   En caso de inclemencias del tiempo, por favor llame a Lacy Duverney principal al [phone] para una actualizacin sobre el [place] de cualquier retraso o  cierre.  Consejos para la medicacin en dermatologa: Por favor, guarde las cajas en las que vienen los medicamentos de uso tpico para ayudarle a seguir las instrucciones sobre dnde y cmo usarlos. Las farmacias generalmente imprimen las instrucciones del medicamento slo en las cajas y no directamente en los tubos del [place].   Si su medicamento es muy caro, por favor, pngase en contacto con Rolm Gala llamando al [phone] y presione la opcin 4 o envenos un mensaje a travs de Clinical cytogeneticist.   No podemos decirle cul ser su copago por los medicamentos por adelantado ya que esto es diferente dependiendo de la cobertura de su seguro. Sin embargo, es posible que podamos encontrar un medicamento sustituto a Audiological scientist un formulario para que el seguro cubra el medicamento que se considera necesario.   Si se requiere una autorizacin previa para que su compaa de seguros [place] su medicamento, por favor permtanos de 1 a 2 das hbiles para completar [address]  Los precios de los medicamentos varan con frecuencia dependiendo del Environmental consultant  de dnde se surte la receta y alguna farmacias pueden ofrecer precios ms baratos.  El sitio web www.goodrx.com tiene cupones para medicamentos de Health and safety inspector. Los precios aqu no tienen en cuenta lo que podra costar con la ayuda del seguro (puede ser ms barato con su seguro), pero el sitio web puede darle el precio si no utiliz Tourist information centre manager.  - Puede imprimir el cupn correspondiente y llevarlo con su receta a la farmacia.  - Tambin puede pasar por nuestra oficina durante el horario de atencin regular y Education officer, museum una tarjeta de cupones de GoodRx.  - Si necesita que su receta se enve electrnicamente a una farmacia diferente, informe a nuestra oficina a travs de MyChart de [place] o por telfono llamando al [phone] y presione la opcin 4.

## 2023-06-07 NOTE — Progress Notes (Signed)
   Follow-Up Visit   Subjective  Monica Forbes is a 27 y.o. 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 She would like to make sure she is using correct skin care and not causing damage to her skin as she is aging. She has noticed spots that come up but then fade, not pimples.   The patient has spots, moles and lesions to be evaluated, some may be new or changing and the patient may have concern these could be cancer.   The following portions of the chart were reviewed this encounter and updated as appropriate: medications, allergies, medical history  Review of Systems:  No other skin or systemic complaints except as noted in HPI or Assessment and Plan.  Objective  Well appearing patient in no apparent distress; mood and affect are within normal limits.   A focused examination was performed of the following areas: face  Relevant exam findings are noted in the Assessment and Plan.    Assessment & Plan   Sebaceous Hyperplasia - Small yellow papules with a central dell - Benign-appearing - Observe. Call for changes.  MELANOCYTIC NEVI Exam: Tan-brown and/or pink-flesh-colored symmetric macules and papules  Treatment Plan: Benign appearing on exam today. Recommend observation. Call clinic for new or changing moles. Recommend daily use of broad spectrum spf 30+ sunscreen to sun-exposed areas.   ACNE VULGARIS Exam: comedones and inflammatory papules Chronic and persistent condition with duration or expected duration over one year. Condition is symptomatic / bothersome to patient. Not to goal. Treatment Plan: Start Amzeeq twice daily Start thin coat of Winlevi twice daily  Acne vulgaris  Related Medications Clascoterone (WINLEVI) 1 % CREA Thin coat twice daily to face   History of telangiectasia nose  Now resolved after ED treatment last visit.  Patient pleased.    Return in about 6 months  (around 12/05/2023) for acne.  Anise Salvo, RMA, am acting as scribe for Armida Sans, MD .   Documentation: I have reviewed the above documentation for accuracy and completeness, and I agree with the above.  Armida Sans, MD

## 2024-01-18 ENCOUNTER — Encounter: Payer: Self-pay | Admitting: Emergency Medicine

## 2024-01-18 ENCOUNTER — Ambulatory Visit
Admission: EM | Admit: 2024-01-18 | Discharge: 2024-01-18 | Disposition: A | Discharge status: Home / Self Care | Source: Ambulatory Visit

## 2024-01-18 DIAGNOSIS — R350 Frequency of micturition: Secondary | ICD-10-CM | POA: Insufficient documentation

## 2024-01-18 DIAGNOSIS — R300 Dysuria: Secondary | ICD-10-CM | POA: Diagnosis present

## 2024-01-18 DIAGNOSIS — Z113 Encounter for screening for infections with a predominantly sexual mode of transmission: Secondary | ICD-10-CM | POA: Diagnosis not present

## 2024-01-18 DIAGNOSIS — F411 Generalized anxiety disorder: Secondary | ICD-10-CM | POA: Diagnosis not present

## 2024-01-18 LAB — POCT URINALYSIS DIP (MANUAL ENTRY)
Bilirubin, UA: NEGATIVE
Blood, UA: NEGATIVE
Glucose, UA: NEGATIVE mg/dL
Ketones, POC UA: NEGATIVE mg/dL
Nitrite, UA: NEGATIVE
Protein Ur, POC: NEGATIVE mg/dL
Spec Grav, UA: 1.02 (ref 1.010–1.025)
Urobilinogen, UA: 0.2 U/dL
pH, UA: 6 (ref 5.0–8.0)

## 2024-01-18 LAB — POCT URINE PREGNANCY: Preg Test, Ur: NEGATIVE

## 2024-01-18 MED ORDER — HYDROXYZINE HCL 25 MG PO TABS: 25.0000 mg | ORAL_TABLET | Freq: Four times a day (QID) | ORAL | 2 refills | Status: AC

## 2024-01-18 MED ORDER — PHENAZOPYRIDINE HCL 200 MG PO TABS: 200.0000 mg | ORAL_TABLET | Freq: Three times a day (TID) | ORAL | 0 refills | Status: AC

## 2024-01-18 NOTE — ED Triage Notes (Signed)
 Patient complains of urinary frequency and painful urination x 2 days. Denies Vaginal discharge. Reports that she has not taken anything for symptoms.

## 2024-01-18 NOTE — Discharge Instructions (Signed)
  1. Increased urinary frequency (Primary) - POCT urine pregnancy completed in UC is negative - POCT urinalysis dipstick completed in UC shows trace leukocytes, no nitrite, no blood, no significant sign of urinary tract infection.  Will evaluate further with urinary culture. - Cervicovaginal swab collected in UC and sent to lab for further testing results should be available in 2 to 3 days. - phenazopyridine  (PYRIDIUM ) 200 MG tablet; Take 1 tablet (200 mg total) by mouth 3 (three) times daily.  Dispense: 6 tablet; Refill: 0 - Urine Culture collected in UC and sent to lab for further testing results should be available in 2 to 3 days.  2. Anxiety state - hydrOXYzine  (ATARAX ) 25 MG tablet; Take 1 tablet (25 mg total) by mouth every 6 (six) hours.  Dispense: 20 tablet; Refill: 2 - If symptoms become worse or medication is not helping follow-up with [facility] behavioral health urgent care for further evaluation and resources.
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
## 2024-01-20 LAB — URINE CULTURE
Culture: 20000 — AB
Special Requests: NORMAL

## 2024-01-21 ENCOUNTER — Ambulatory Visit (HOSPITAL_COMMUNITY): Payer: Self-pay

## 2024-01-21 LAB — CERVICOVAGINAL ANCILLARY ONLY
Bacterial Vaginitis (gardnerella): NEGATIVE
Candida Glabrata: NEGATIVE
Candida Vaginitis: NEGATIVE
Chlamydia: NEGATIVE
Comment: NEGATIVE
Comment: NEGATIVE
Comment: NEGATIVE
Comment: NEGATIVE
Comment: NEGATIVE
Comment: NORMAL
Neisseria Gonorrhea: NEGATIVE
Trichomonas: NEGATIVE

## 2024-01-21 MED ORDER — NITROFURANTOIN MONOHYD MACRO 100 MG PO CAPS: 100.0000 mg | ORAL_CAPSULE | Freq: Two times a day (BID) | ORAL | 0 refills | Status: AC

## 2024-05-09 ENCOUNTER — Ambulatory Visit: Admitting: *Deleted

## 2024-05-09 ENCOUNTER — Ambulatory Visit: Attending: Maternal & Fetal Medicine | Admitting: Maternal & Fetal Medicine

## 2024-05-09 DIAGNOSIS — Z8742 Personal history of other diseases of the female genital tract: Secondary | ICD-10-CM | POA: Insufficient documentation

## 2024-05-09 DIAGNOSIS — Z3169 Encounter for other general counseling and advice on procreation: Secondary | ICD-10-CM

## 2024-05-09 DIAGNOSIS — Z87820 Personal history of traumatic brain injury: Secondary | ICD-10-CM

## 2024-05-09 DIAGNOSIS — Z9889 Other specified postprocedural states: Secondary | ICD-10-CM | POA: Diagnosis not present

## 2024-05-09 DIAGNOSIS — Z7189 Other specified counseling: Secondary | ICD-10-CM | POA: Diagnosis present
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
## 2024-05-09 NOTE — Progress Notes (Signed)
 Here for preconception consult. BP 104/52          Pulse 73

## 2024-07-15 ENCOUNTER — Encounter: Payer: Self-pay | Admitting: Emergency Medicine

## 2024-07-15 ENCOUNTER — Ambulatory Visit
Admission: EM | Admit: 2024-07-15 | Discharge: 2024-07-15 | Disposition: A | Discharge status: Home / Self Care | Attending: Emergency Medicine | Admitting: Emergency Medicine

## 2024-07-15 DIAGNOSIS — J101 Influenza due to other identified influenza virus with other respiratory manifestations: Secondary | ICD-10-CM

## 2024-07-15 DIAGNOSIS — B349 Viral infection, unspecified: Secondary | ICD-10-CM | POA: Diagnosis not present

## 2024-07-15 LAB — POC SOFIA SARS ANTIGEN FIA: SARS Coronavirus 2 Ag: NEGATIVE

## 2024-07-15 LAB — POCT INFLUENZA A/B
Influenza A, POC: NEGATIVE
Influenza B, POC: POSITIVE — AB

## 2024-07-15 MED ORDER — OSELTAMIVIR PHOSPHATE 75 MG PO CAPS: 75.0000 mg | ORAL_CAPSULE | Freq: Two times a day (BID) | ORAL | 0 refills | Status: AC

## 2024-07-15 NOTE — Discharge Instructions (Addendum)
 Influenza B is a virus and should steadily improve in time it can take up to 7 to 10 days before you truly start to see a turnaround however things will get better  Begin Tamiflu every morning and every evening for 5 days to reduce the amount of virus in the body which helps to minimize symptoms  Will need to quarantine until without fever for 24 hours.,  If no fever may continue activity wearing mask for 5 days from the start of your symptoms    You can take Tylenol  and/or Ibuprofen  as needed for fever reduction and pain relief.   For cough: honey 1/2 to 1 teaspoon (you can dilute the honey in water or another fluid).  You can also use guaifenesin  and dextromethorphan for cough. You can use a humidifier for chest congestion and cough.  If you don't have a humidifier, you can sit in the bathroom with the hot shower running.      For sore throat: try warm salt water gargles, cepacol lozenges, throat spray, warm tea or water with lemon/honey, popsicles or ice, or OTC cold relief medicine for throat discomfort.   For congestion: take a daily anti-histamine like Zyrtec, Claritin, and a oral decongestant, such as pseudoephedrine.  You can also use Flonase  1-2 sprays in each nostril daily.   It is important to stay hydrated: drink plenty of fluids (water, gatorade/powerade/pedialyte, juices, or teas) to keep your throat moisturized and help further relieve irritation/discomfort.

## 2024-07-15 NOTE — ED Triage Notes (Signed)
 Patient complains of sneezing, bodyaches, cough with clear mucus x 3 days. Patient has taken Sudafed, Dayquil, and Nyquil with mild relief. Rates bodyaches 3/10.
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
